# Patient Record
Sex: Female | Born: 1970 | Race: Black or African American | Hispanic: No | Marital: Married | Smoking: Never smoker
Health system: Southern US, Community
[De-identification: ages and names within clinical notes are randomized; demographics above are authoritative.]

## PROBLEM LIST (undated history)

## (undated) DIAGNOSIS — M51369 Other intervertebral disc degeneration, lumbar region without mention of lumbar back pain or lower extremity pain: Secondary | ICD-10-CM

## (undated) DIAGNOSIS — IMO0002 Reserved for concepts with insufficient information to code with codable children: Secondary | ICD-10-CM

## (undated) DIAGNOSIS — Z8619 Personal history of other infectious and parasitic diseases: Secondary | ICD-10-CM

## (undated) DIAGNOSIS — E041 Nontoxic single thyroid nodule: Secondary | ICD-10-CM

## (undated) DIAGNOSIS — Z8742 Personal history of other diseases of the female genital tract: Secondary | ICD-10-CM

## (undated) DIAGNOSIS — R87619 Unspecified abnormal cytological findings in specimens from cervix uteri: Secondary | ICD-10-CM

## (undated) DIAGNOSIS — D259 Leiomyoma of uterus, unspecified: Secondary | ICD-10-CM

## (undated) DIAGNOSIS — M1990 Unspecified osteoarthritis, unspecified site: Secondary | ICD-10-CM

## (undated) DIAGNOSIS — N946 Dysmenorrhea, unspecified: Secondary | ICD-10-CM

## (undated) DIAGNOSIS — K219 Gastro-esophageal reflux disease without esophagitis: Secondary | ICD-10-CM

## (undated) DIAGNOSIS — A64 Unspecified sexually transmitted disease: Secondary | ICD-10-CM

## (undated) DIAGNOSIS — N920 Excessive and frequent menstruation with regular cycle: Secondary | ICD-10-CM

## (undated) DIAGNOSIS — Z973 Presence of spectacles and contact lenses: Secondary | ICD-10-CM

## (undated) DIAGNOSIS — D649 Anemia, unspecified: Secondary | ICD-10-CM

## (undated) DIAGNOSIS — I10 Essential (primary) hypertension: Secondary | ICD-10-CM

## (undated) HISTORY — DX: Reserved for concepts with insufficient information to code with codable children: IMO0002

## (undated) HISTORY — PX: BIOPSY THYROID: PRO38

## (undated) HISTORY — PX: TOOTH EXTRACTION: SUR596

## (undated) HISTORY — DX: Gastro-esophageal reflux disease without esophagitis: K21.9

## (undated) HISTORY — DX: Nontoxic single thyroid nodule: E04.1

## (undated) HISTORY — DX: Unspecified abnormal cytological findings in specimens from cervix uteri: R87.619

## (undated) HISTORY — DX: Unspecified osteoarthritis, unspecified site: M19.90

## (undated) HISTORY — DX: Unspecified sexually transmitted disease: A64

## (undated) HISTORY — DX: Essential (primary) hypertension: I10

---

## 1999-06-16 ENCOUNTER — Other Ambulatory Visit: Admission: RE | Admit: 1999-06-16 | Discharge: 1999-06-16 | Payer: Self-pay | Admitting: Obstetrics and Gynecology

## 2000-07-26 ENCOUNTER — Other Ambulatory Visit: Admission: RE | Admit: 2000-07-26 | Discharge: 2000-07-26 | Payer: Self-pay | Admitting: Obstetrics and Gynecology

## 2001-11-14 ENCOUNTER — Other Ambulatory Visit: Admission: RE | Admit: 2001-11-14 | Discharge: 2001-11-14 | Payer: Self-pay | Admitting: Obstetrics and Gynecology

## 2002-09-28 ENCOUNTER — Inpatient Hospital Stay (HOSPITAL_COMMUNITY): Admission: AD | Admit: 2002-09-28 | Discharge: 2002-09-28 | Payer: Self-pay | Admitting: Obstetrics and Gynecology

## 2003-03-16 ENCOUNTER — Inpatient Hospital Stay (HOSPITAL_COMMUNITY): Admission: AD | Admit: 2003-03-16 | Discharge: 2003-03-19 | Payer: Self-pay | Admitting: Obstetrics and Gynecology

## 2003-05-04 ENCOUNTER — Other Ambulatory Visit: Admission: RE | Admit: 2003-05-04 | Discharge: 2003-05-04 | Payer: Self-pay | Admitting: Obstetrics and Gynecology

## 2004-08-09 ENCOUNTER — Other Ambulatory Visit: Admission: RE | Admit: 2004-08-09 | Discharge: 2004-08-09 | Payer: Self-pay | Admitting: Obstetrics and Gynecology

## 2005-12-06 ENCOUNTER — Other Ambulatory Visit: Admission: RE | Admit: 2005-12-06 | Discharge: 2005-12-06 | Payer: Self-pay | Admitting: Obstetrics & Gynecology

## 2006-01-16 DIAGNOSIS — E042 Nontoxic multinodular goiter: Secondary | ICD-10-CM

## 2006-01-16 HISTORY — DX: Nontoxic multinodular goiter: E04.2

## 2006-03-07 ENCOUNTER — Encounter: Admission: RE | Admit: 2006-03-07 | Discharge: 2006-03-07 | Payer: Self-pay | Admitting: *Deleted

## 2006-04-26 ENCOUNTER — Encounter: Admission: RE | Admit: 2006-04-26 | Discharge: 2006-04-26 | Payer: Self-pay | Admitting: Family Medicine

## 2007-01-02 ENCOUNTER — Encounter: Admission: RE | Admit: 2007-01-02 | Discharge: 2007-01-02 | Payer: Self-pay | Admitting: Family Medicine

## 2007-01-15 ENCOUNTER — Encounter: Admission: RE | Admit: 2007-01-15 | Discharge: 2007-01-15 | Payer: Self-pay | Admitting: Family Medicine

## 2007-01-15 ENCOUNTER — Encounter (INDEPENDENT_AMBULATORY_CARE_PROVIDER_SITE_OTHER): Payer: Self-pay | Admitting: Interventional Radiology

## 2007-01-15 ENCOUNTER — Other Ambulatory Visit: Admission: RE | Admit: 2007-01-15 | Discharge: 2007-01-15 | Payer: Self-pay | Admitting: Interventional Radiology

## 2007-01-25 ENCOUNTER — Other Ambulatory Visit: Admission: RE | Admit: 2007-01-25 | Discharge: 2007-01-25 | Payer: Self-pay | Admitting: Obstetrics and Gynecology

## 2007-05-07 ENCOUNTER — Encounter: Admission: RE | Admit: 2007-05-07 | Discharge: 2007-05-07 | Payer: Self-pay | Admitting: Family Medicine

## 2007-08-26 ENCOUNTER — Encounter: Admission: RE | Admit: 2007-08-26 | Discharge: 2007-08-26 | Payer: Self-pay

## 2008-04-06 ENCOUNTER — Other Ambulatory Visit: Admission: RE | Admit: 2008-04-06 | Discharge: 2008-04-06 | Payer: Self-pay | Admitting: Obstetrics and Gynecology

## 2009-04-16 IMAGING — US US SOFT TISSUE HEAD/NECK
1 series · 14 of 18 positions shown · non-contrast
Comparison: none

CLINICAL DATA: Enlarged thyroid. 
 THYROID ULTRASOUND:
TECHNIQUE: Ultrasound examination of the thyroid gland and adjacent soft tissue structures was performed.

[Series 1: unknown · 0.09mm/px · 14 of 18 slices shown]
[im 1/18]
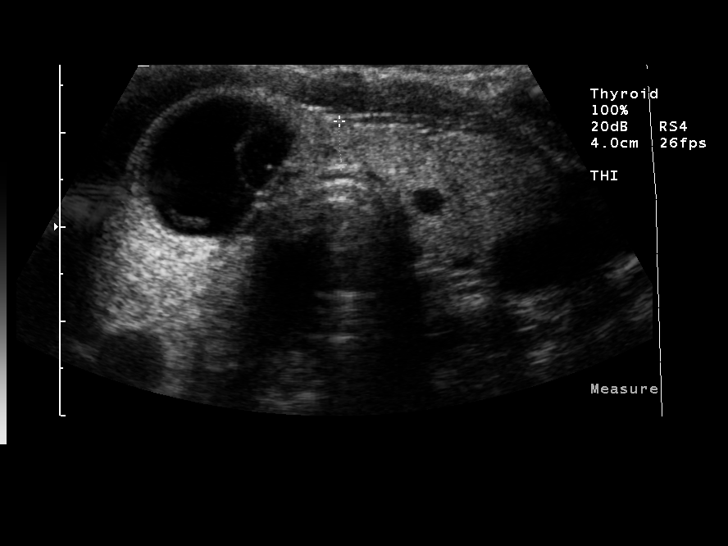
[im 2/18]
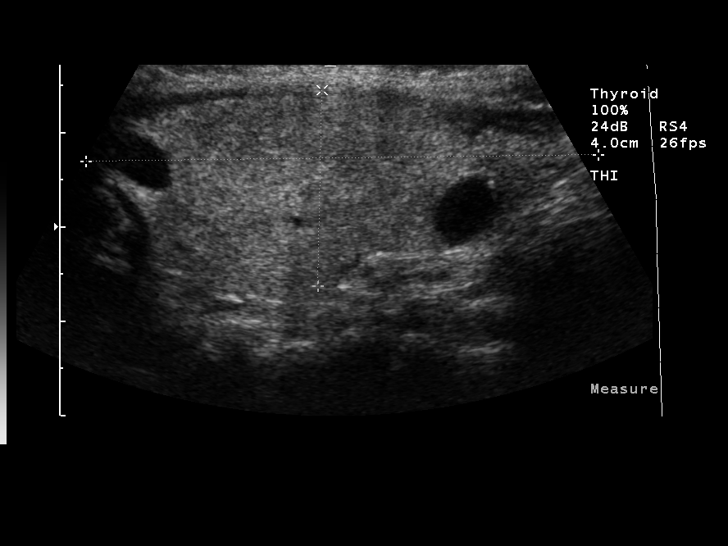
[im 4/18]
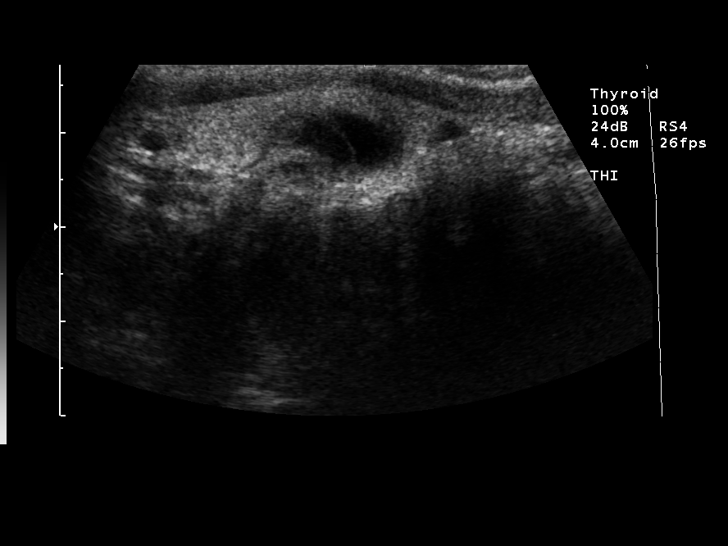
[im 5/18]
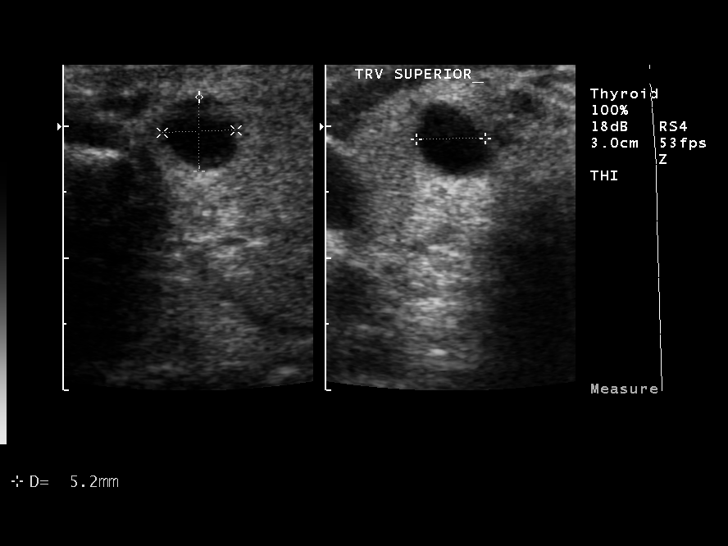
[im 6/18]
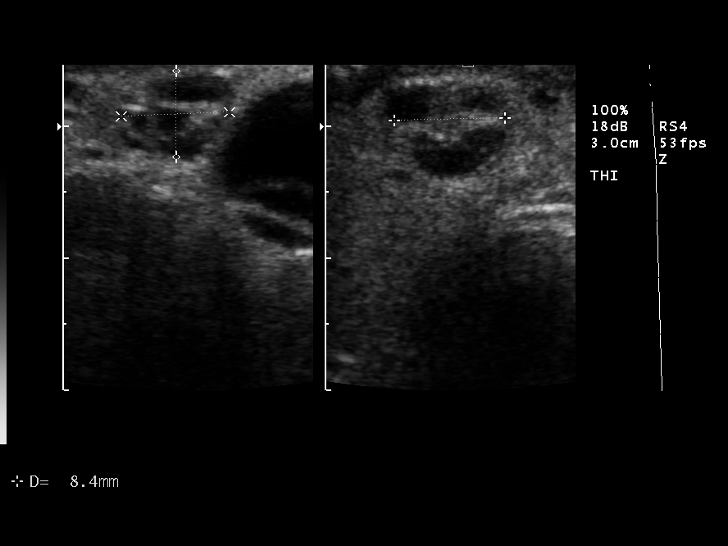
[im 8/18]
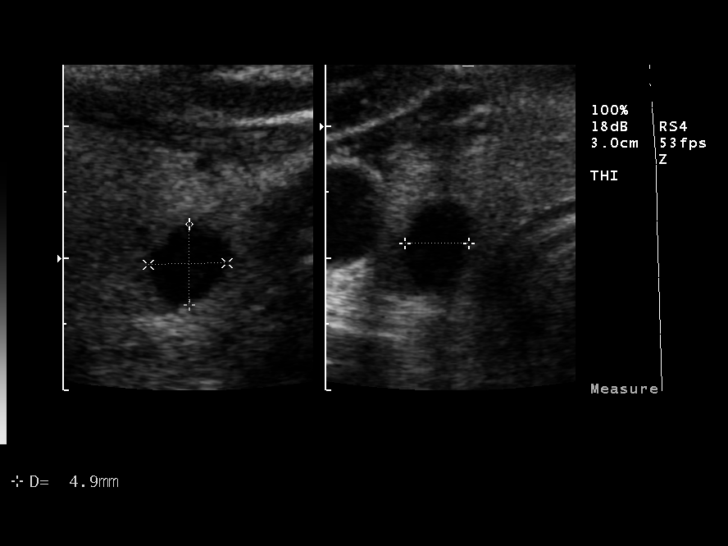
[im 9/18]
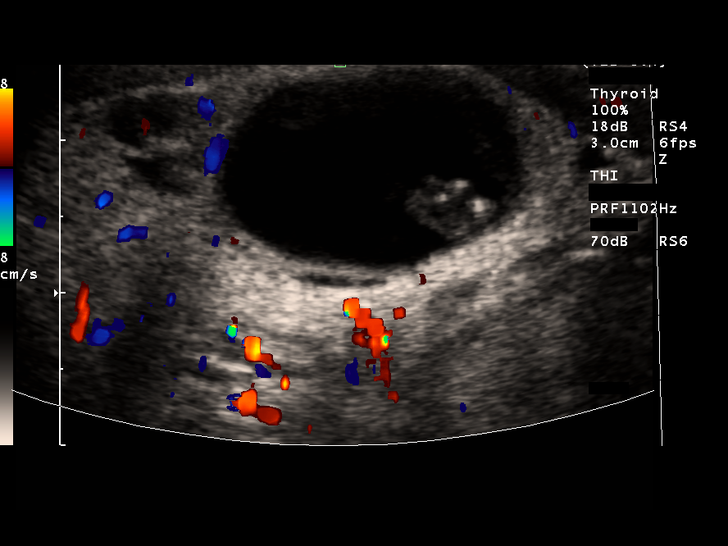
[im 10/18]
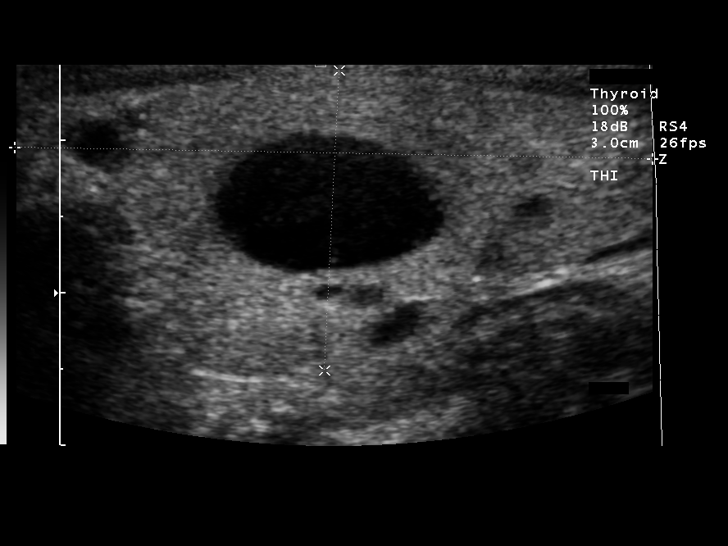
[im 11/18]
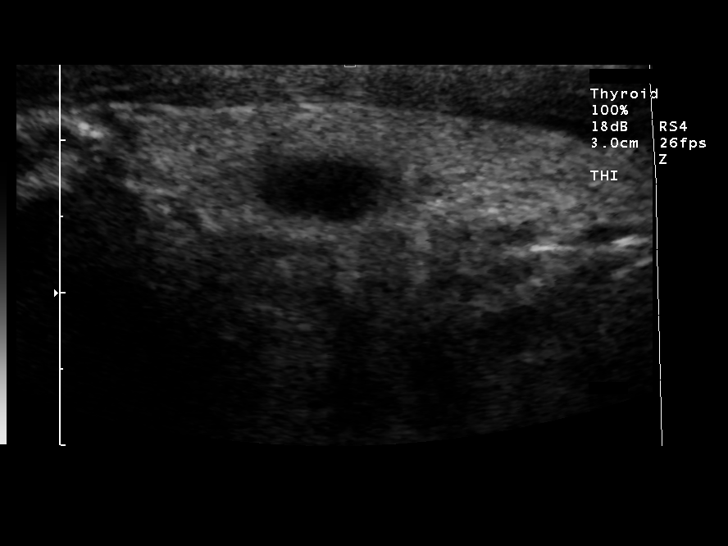
[im 13/18]
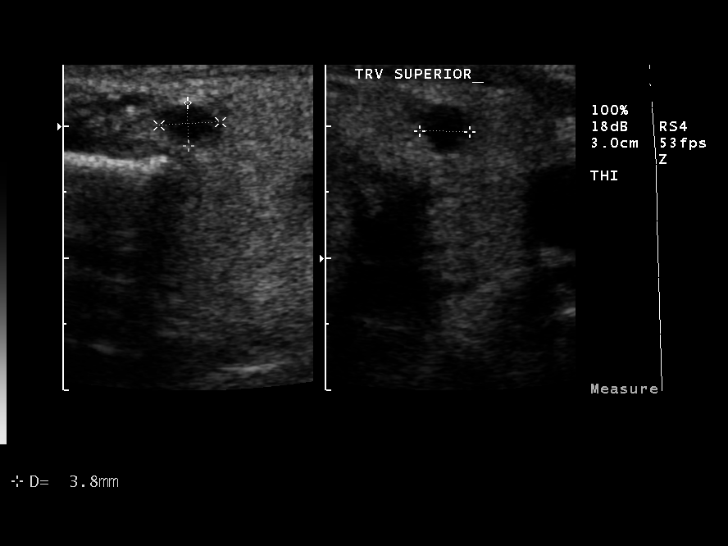
[im 14/18]
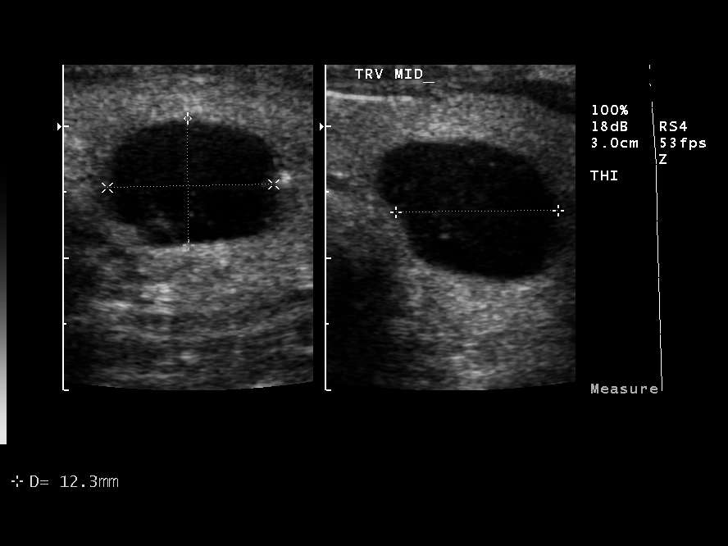
[im 15/18]
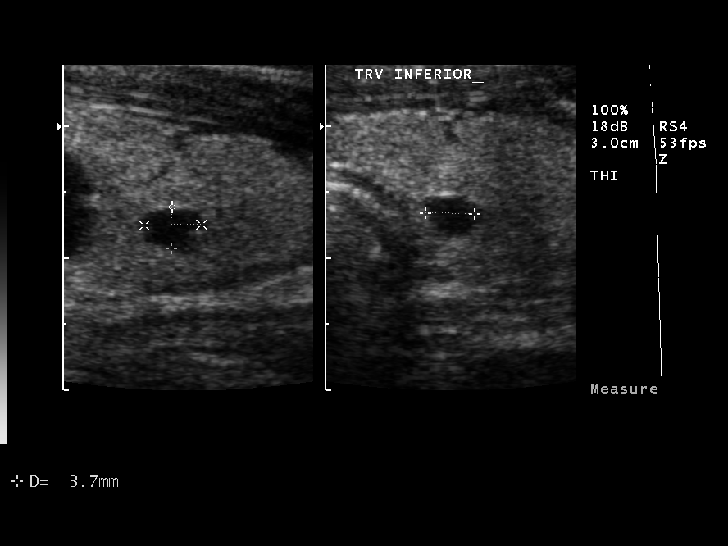
[im 17/18]
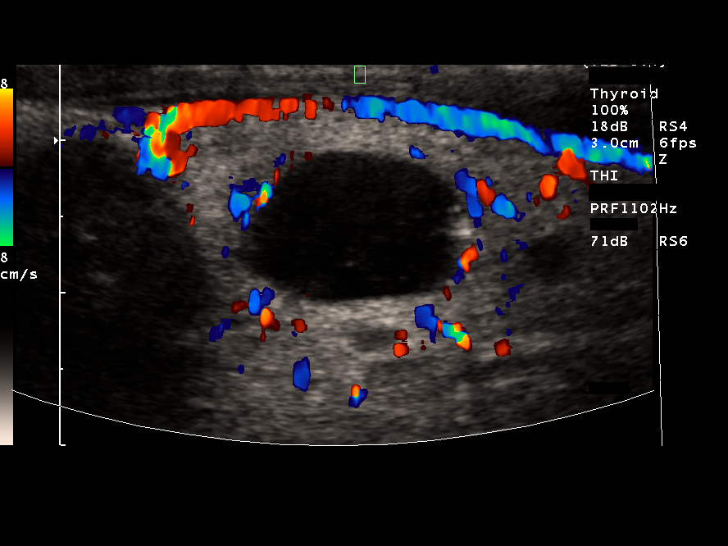
[im 18/18]
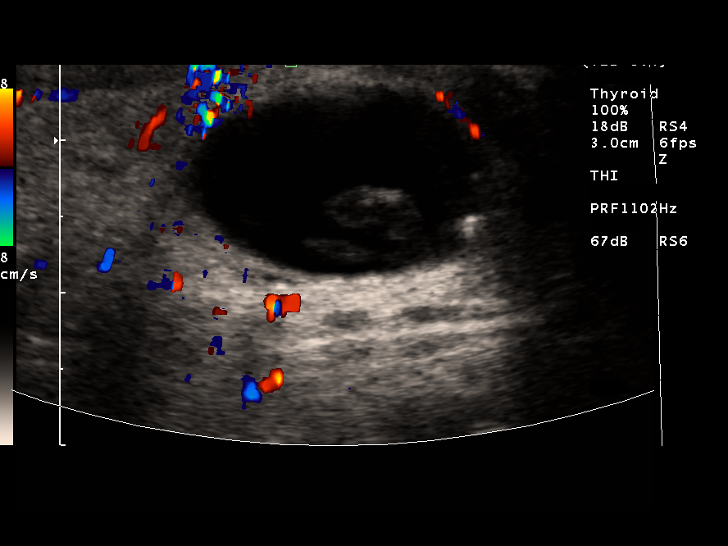

[14 of 18 positions shown; findings below may reference images not displayed]

FINDINGS: The thyroid gland is within upper limits of normal.  The right lobe measures 5.4 cm sagittally with a depth of 2.0 cm and width of 1.7 cm.  The left lobe measures 4.2 x 2.0 x 1.7 cm with the isthmus measuring 4.6 mm.  Multiple nodules are present bilaterally primarily cystic.  The largest nodule is in the lower pole of the right lobe measuring 1.8 x 1.3 x 1.7 cm.  Another cystic lesion is noted in the left midlobe of 1.2 x 1.2 x 1.0 cm.  Several smaller cysts are present of 8 mm or less in diameter, with the 8 mm nodule being complex in the upper medial aspect of the right lobe measuring 8 mm.
IMPRESSION: Multiple cystic nodules bilaterally as described above.  Only a single complex nodule is noted in the upper right lobe medially.  Thyroid gland is within upper limits of normal.

## 2009-04-16 IMAGING — US US SOFT TISSUE HEAD/NECK
1 series · 2 of 2 positions shown · non-contrast
Comparison: none

CLINICAL DATA: Enlarged thyroid. 
 THYROID ULTRASOUND:
TECHNIQUE: Ultrasound examination of the thyroid gland and adjacent soft tissue structures was performed.

[Series 1: unknown · 0.05mm/px · 2 of 2 slices shown]
[im 1/2]
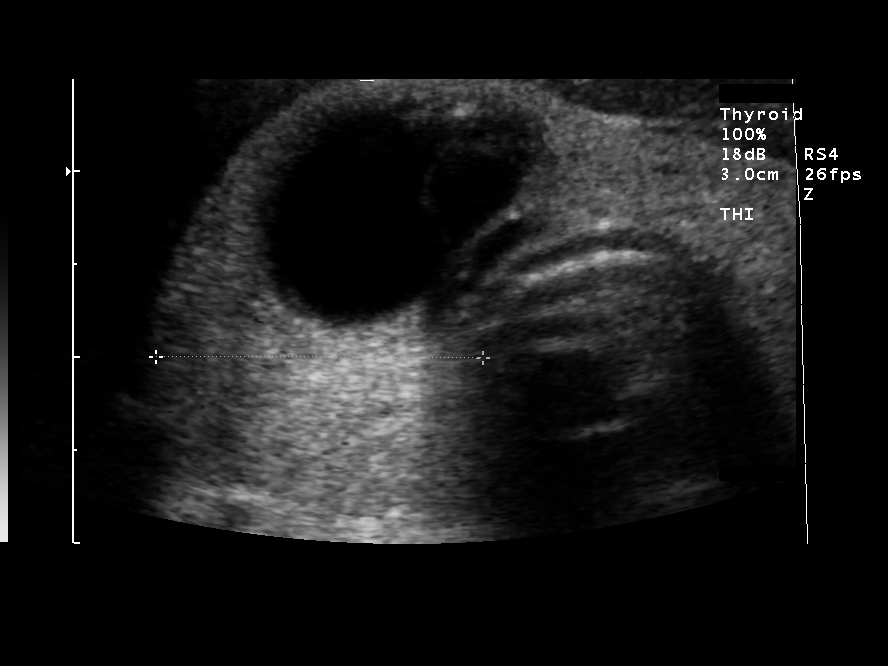
[im 2/2]
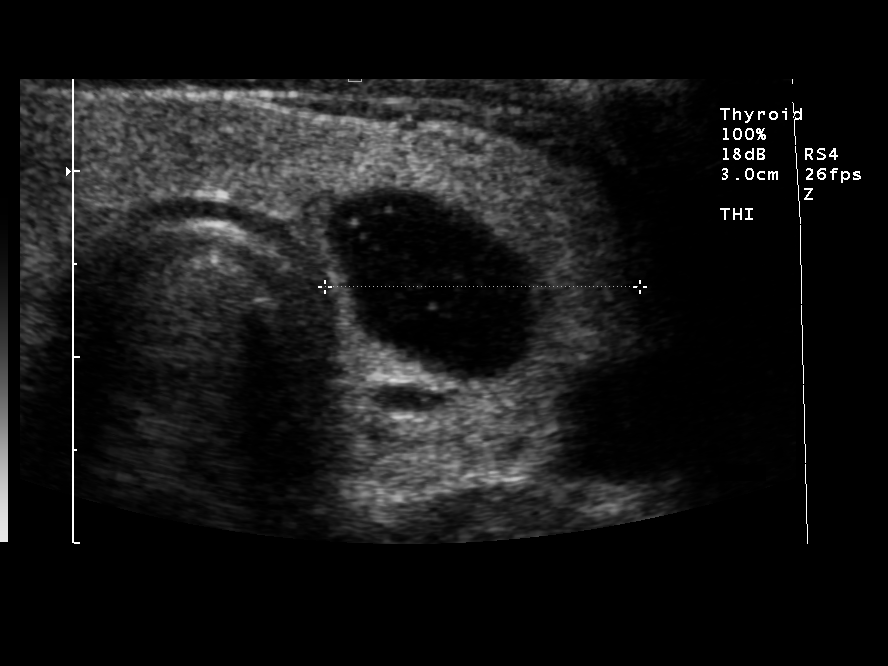

[2 of 2 positions shown; findings below may reference images not displayed]

FINDINGS: The thyroid gland is within upper limits of normal.  The right lobe measures 5.4 cm sagittally with a depth of 2.0 cm and width of 1.7 cm.  The left lobe measures 4.2 x 2.0 x 1.7 cm with the isthmus measuring 4.6 mm.  Multiple nodules are present bilaterally primarily cystic.  The largest nodule is in the lower pole of the right lobe measuring 1.8 x 1.3 x 1.7 cm.  Another cystic lesion is noted in the left midlobe of 1.2 x 1.2 x 1.0 cm.  Several smaller cysts are present of 8 mm or less in diameter, with the 8 mm nodule being complex in the upper medial aspect of the right lobe measuring 8 mm.
IMPRESSION: Multiple cystic nodules bilaterally as described above.  Only a single complex nodule is noted in the upper right lobe medially.  Thyroid gland is within upper limits of normal.

## 2010-06-03 NOTE — H&P (Signed)
NAME:  Jennifer Nolan, Jennifer Nolan                       ACCOUNT NO.:  [id number]   MEDICAL RECORD NO.:  [id number]                   PATIENT TYPE:  INP   LOCATION:  9168                                 FACILITY:  [facility]   PHYSICIAN:  Osborn Coho, M.D.                DATE OF BIRTH:  28-Nov-1970   DATE OF ADMISSION:  03/16/2003  DATE OF DISCHARGE:                                HISTORY & PHYSICAL   [name] is a 40 year old gravida 1, para 0, at 40 weeks, who presents  with uterine contractions every one to two minutes since 9 p.m.  She reports  positive bloody show, reports positive fetal movement.  She denies any HSV  lesions or prodrome.  Pregnancy has been remarkable for:   1. 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  2. Questionable LMP.    PRENATAL LABORATORY DATA:  Blood type is A positive, Rh antibody negative.  VDRL nonreactive.  Sickle cell test negative.  GC and Chlamydia cultures  were negative.  Pap was normal in October.  Hepatitis B surface antigen was  negative.  Rubella was positive.  Syphilis was nonreactive.  HIV was  nonreactive.  Cystic fibrosis testing was negative.  The patient has a  positive urine culture at initial visit for Citrobacter, which was treated  with Macrobid.  EDC of March 16, 2003, was established by ultrasound at  12 weeks.  Quadruple screen was negative.  She had a normal Glucola.  Her  hemoglobin upon entering the practice was 11.  It was 10 at 28 weeks.  She  started iron supplementation at 28 weeks.  She had some cervical pain at 33  weeks with no abnormal findings.  She had a urine culture at that time that  was negative.  She began Valtrex prophylaxis suppressive therapy at 34  weeks.  Her group B strep, GC and Chlamydia cultures were all negative at 36  weeks.   PAST OBSTETRICAL HISTORY:  The patient is a primigravida.   PAST MEDICAL HISTORY:  The patient was on oral contraceptives and stopped  approximately three years ago.  Then she was a condom user.  She was treated  for Chlamydia eight years ago.  She was diagnosed with herpes in 1992 and  has had two to three outbreaks per year but none during pregnancy.  She has  frequent yeast infections.  She reports the usual childhood illnesses.  She  has one to two urinary tract infections a year.   She has no known medication allergies.   FAMILY HISTORY:  Her maternal grandfather has heart disease.  Her maternal  grandmother had hypertension and is on medication.  Her father has thyroid  problems.  Her maternal uncle has chronic renal disease.   GENETIC HISTORY:  Unremarkable.   SOCIAL HISTORY:  The patient is single.  The father of the baby is  involved  and supportive.  His name is Jennifer Nolan.  The patient is [place], of the [employer].  She has been followed by the certified  nurse midwife service of [place].  She denies any alcohol, drug,  or tobacco use during this pregnancy.  She is graduate-educated and is  employed as a Scientist, physiological.  Her partner is in a Engineer, building services and  is a Runner, broadcasting/film/video in a [employer] high school.   PHYSICAL EXAMINATION:  VITAL SIGNS:  Stable.  The patient is afebrile.  HEENT:  Within normal limits.  CHEST:  Bilateral breath sounds are clear.  CARDIAC:  Regular rate and rhythm without murmur.  BREASTS:  Soft and nontender.  ABDOMEN:  Fundal height is approximately 38 cm.  The estimated fetal weight  is 7-8 pounds.  Uterine contractions are every two minutes, 60 seconds in  duration, strong quality.  Fetal monitor shows a nonreactive tracing but a  negative spontaneous CST.  There are no decelerations noted.  PELVIC:  Cervix is slightly posterior, 5 cm, 80%, vertex at a -1 to a -2.  There is bloody show noted.  No HSV lesions are noted.  EXTREMITIES:  Deep tendon reflexes are 2+ without clonus.  There is a trace  edema noted.   IMPRESSION:  1. Intrauterine pregnancy  at 40 weeks.  2. Active labor.   PLAN:  1. Admit to birthing suite per consult with Dr. Su Hilt as attending     physician.  2. Routine certified nurse midwife orders.  3. The patient wants IV pain medication initially and then may desire an     epidural as labor progresses.     Jennifer Nolan, C.N.M.                   Osborn Coho, M.D.    VLL/MEDQ  D:  03/17/2003  T:  03/17/2003  Job:  [id number]

## 2010-12-26 ENCOUNTER — Other Ambulatory Visit: Payer: Self-pay | Admitting: Certified Nurse Midwife

## 2010-12-26 DIAGNOSIS — Z1231 Encounter for screening mammogram for malignant neoplasm of breast: Secondary | ICD-10-CM

## 2011-01-27 ENCOUNTER — Ambulatory Visit
Admission: RE | Admit: 2011-01-27 | Discharge: 2011-01-27 | Disposition: A | Discharge status: Home / Self Care | Payer: 59 | Source: Ambulatory Visit | Attending: Certified Nurse Midwife | Admitting: Certified Nurse Midwife

## 2011-01-27 DIAGNOSIS — Z1231 Encounter for screening mammogram for malignant neoplasm of breast: Secondary | ICD-10-CM

## 2012-03-12 ENCOUNTER — Other Ambulatory Visit: Payer: Self-pay

## 2012-03-12 DIAGNOSIS — Z1231 Encounter for screening mammogram for malignant neoplasm of breast: Secondary | ICD-10-CM

## 2012-04-09 ENCOUNTER — Ambulatory Visit
Admission: RE | Admit: 2012-04-09 | Discharge: 2012-04-09 | Disposition: A | Discharge status: Home / Self Care | Payer: 59 | Source: Ambulatory Visit

## 2012-04-09 DIAGNOSIS — Z1231 Encounter for screening mammogram for malignant neoplasm of breast: Secondary | ICD-10-CM

## 2012-09-04 ENCOUNTER — Encounter: Payer: Self-pay | Admitting: Certified Nurse Midwife

## 2012-09-20 ENCOUNTER — Ambulatory Visit: Payer: Self-pay | Admitting: Certified Nurse Midwife

## 2012-09-26 ENCOUNTER — Encounter: Payer: Self-pay | Admitting: Certified Nurse Midwife

## 2012-09-27 ENCOUNTER — Encounter: Payer: Self-pay | Admitting: Certified Nurse Midwife

## 2012-09-27 ENCOUNTER — Ambulatory Visit (INDEPENDENT_AMBULATORY_CARE_PROVIDER_SITE_OTHER): Payer: 59 | Admitting: Certified Nurse Midwife

## 2012-09-27 VITALS — BP 104/70 | HR 64 | Resp 16 | Ht 65.25 in | Wt 163.0 lb

## 2012-09-27 DIAGNOSIS — Z0000 Encounter for general adult medical examination without abnormal findings: Secondary | ICD-10-CM

## 2012-09-27 DIAGNOSIS — Z01419 Encounter for gynecological examination (general) (routine) without abnormal findings: Secondary | ICD-10-CM

## 2012-09-27 DIAGNOSIS — B009 Herpesviral infection, unspecified: Secondary | ICD-10-CM

## 2012-09-27 LAB — HEMOGLOBIN, FINGERSTICK: Hemoglobin, fingerstick: 12.2 g/dL (ref 12.0–16.0)

## 2012-09-27 MED ORDER — CONCEPT OB 130-92.4-1 MG PO CAPS: 1.0000 | ORAL_CAPSULE | Freq: Every day | ORAL | Status: DC

## 2012-09-27 MED ORDER — VALACYCLOVIR HCL 1 G PO TABS: 1000.0000 mg | ORAL_TABLET | ORAL | Status: DC | PRN

## 2012-09-27 NOTE — Progress Notes (Signed)
42 y.o. G1P1001 Married African American Fe here for annual exam. Periods changing slightly in amount, now has 2 heavy days then moderate to scant. No cramping. Heavy bleeding does not require clothing changes. No other health changes, or health issues today.  Patient's last menstrual period was 09/10/2012.          Sexually active: yes  The current method of family planning is none.    Exercising: no  exercise Smoker:  no  Health Maintenance: Pap:  09-20-11 neg  HPV HR neg MMG: 04-09-12 normal Colonoscopy:  none BMD:   none TDaP: 2012 Labs: Hgb-12.2 Self breast exam: done monthly   reports that she has never smoked. She does not have any smokeless tobacco history on file. She reports that she drinks about 0.5 ounces of alcohol per week. She reports that she does not use illicit drugs.  Past Medical History  Diagnosis Date  . STD (sexually transmitted disease)     HSV2  . Abnormal Pap smear   . GERD (gastroesophageal reflux disease)   . Thyroid cyst     drained it    Past Surgical History  Procedure Laterality Date  . Biopsy thyroid      Current Outpatient Prescriptions  Medication Sig Dispense Refill  . cycloSPORINE (RESTASIS) 0.05 % ophthalmic emulsion 1 drop daily.       . IRON PO Take by mouth daily.      Marland Kitchen levocetirizine (XYZAL) 5 MG tablet daily.      Marland Kitchen NASONEX 50 MCG/ACT nasal spray daily.      Burnis Medin w/o A Vit-FeFum-FePo-FA (CONCEPT OB) 130-92.4-1 MG CAPS Take by mouth daily.      . valACYclovir (VALTREX) 1000 MG tablet Take 1,000 mg by mouth as needed.        No current facility-administered medications for this visit.    Family History  Problem Relation Age of Onset  . Hypertension Mother   . Diabetes Mother   . Hypertension Maternal Grandmother     ROS:  Pertinent items are noted in HPI.  Otherwise, a comprehensive ROS was negative.  Exam:   BP 104/70  Pulse 64  Resp 16  Ht 5' 5.25" (1.657 m)  Wt 163 lb (73.936 kg)  BMI 26.93 kg/m2  LMP  09/10/2012 Height: 5' 5.25" (165.7 cm)  Ht Readings from Last 3 Encounters:  09/27/12 5' 5.25" (1.657 m)    General appearance: alert, cooperative and appears stated age Head: Normocephalic, without obvious abnormality, atraumatic Neck: no adenopathy, supple, symmetrical, trachea midline and thyroid enlarged bilateral(known enlargement) no nodules palpated. No size change Lungs: clear to auscultation bilaterally Breasts: normal appearance, no masses or tenderness, No nipple retraction or dimpling, No nipple discharge or bleeding, No axillary or supraclavicular adenopathy Heart: regular rate and rhythm Abdomen: soft, non-tender; no masses,  no organomegaly Extremities: extremities normal, atraumatic, no cyanosis or edema Skin: Skin color, texture, turgor normal. No rashes or lesions Lymph nodes: Cervical, supraclavicular, and axillary nodes normal. No abnormal inguinal nodes palpated Neurologic: Grossly normal   Pelvic: External genitalia:  no lesions              Urethra:  normal appearing urethra with no masses, tenderness or lesions              Bartholin's and Skene's: normal                 Vagina: normal appearing vagina with normal color and discharge, no lesions  Cervix: normal, non tender              Pap taken: no Bimanual Exam:  Uterus:  normal size, contour, position, consistency, mobility, non-tender and anteflexed              Adnexa: normal adnexa and no mass, fullness, tenderness               Rectovaginal: Confirms               Anus:  normal sphincter tone, no lesions  A:  Well Woman with normal exam  Contraception none desired, continues on prenatal vitamins in case of pregnancy( history of infertility)  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

## 2012-09-27 NOTE — Patient Instructions (Signed)

## 2012-09-30 NOTE — Progress Notes (Signed)
Note reviewed, agree with plan.  Mertie Haslem, MD  

## 2012-11-21 ENCOUNTER — Other Ambulatory Visit: Payer: Self-pay

## 2012-11-26 ENCOUNTER — Encounter: Payer: Self-pay | Admitting: Certified Nurse Midwife

## 2013-05-20 ENCOUNTER — Other Ambulatory Visit: Payer: Self-pay

## 2013-05-20 DIAGNOSIS — Z1231 Encounter for screening mammogram for malignant neoplasm of breast: Secondary | ICD-10-CM

## 2013-05-23 ENCOUNTER — Ambulatory Visit
Admission: RE | Admit: 2013-05-23 | Discharge: 2013-05-23 | Disposition: A | Discharge status: Home / Self Care | Payer: 59 | Source: Ambulatory Visit

## 2013-05-23 DIAGNOSIS — Z1231 Encounter for screening mammogram for malignant neoplasm of breast: Secondary | ICD-10-CM

## 2013-10-03 ENCOUNTER — Ambulatory Visit: Payer: 59 | Admitting: Certified Nurse Midwife

## 2013-10-09 ENCOUNTER — Ambulatory Visit: Payer: 59 | Admitting: Certified Nurse Midwife

## 2013-10-17 ENCOUNTER — Ambulatory Visit (INDEPENDENT_AMBULATORY_CARE_PROVIDER_SITE_OTHER): Payer: 59 | Admitting: Certified Nurse Midwife

## 2013-10-17 ENCOUNTER — Encounter: Payer: Self-pay | Admitting: Certified Nurse Midwife

## 2013-10-17 VITALS — BP 104/64 | HR 68 | Resp 16 | Ht 65.25 in | Wt 164.0 lb

## 2013-10-17 DIAGNOSIS — R6889 Other general symptoms and signs: Secondary | ICD-10-CM

## 2013-10-17 DIAGNOSIS — Z01411 Encounter for gynecological examination (general) (routine) with abnormal findings: Secondary | ICD-10-CM

## 2013-10-17 DIAGNOSIS — Z0000 Encounter for general adult medical examination without abnormal findings: Secondary | ICD-10-CM

## 2013-10-17 DIAGNOSIS — Z124 Encounter for screening for malignant neoplasm of cervix: Secondary | ICD-10-CM

## 2013-10-17 DIAGNOSIS — E049 Nontoxic goiter, unspecified: Secondary | ICD-10-CM

## 2013-10-17 DIAGNOSIS — N920 Excessive and frequent menstruation with regular cycle: Secondary | ICD-10-CM

## 2013-10-17 LAB — LIPID PANEL
Cholesterol: 138 mg/dL (ref 0–200)
HDL: 45 mg/dL (ref >39)
LDL Cholesterol: 71 mg/dL (ref 0–99)
Total CHOL/HDL Ratio: 3.1 Ratio
Triglycerides: 112 mg/dL (ref <150)
VLDL: 22 mg/dL (ref 0–40)

## 2013-10-17 LAB — HEMOGLOBIN, FINGERSTICK: Hemoglobin, fingerstick: 11.9 g/dL — ABNORMAL LOW (ref 12.0–16.0)

## 2013-10-17 LAB — TSH: TSH: 0.577 u[IU]/mL (ref 0.350–4.500)

## 2013-10-17 MED ORDER — NORETHIN ACE-ETH ESTRAD-FE 1-20 MG-MCG(24) PO TABS: 1.0000 | ORAL_TABLET | Freq: Every day | ORAL | Status: DC

## 2013-10-17 NOTE — Patient Instructions (Signed)

## 2013-10-17 NOTE — Progress Notes (Signed)
43 y.o. G1P1001 Married African American Fe here for annual exam. Periods have become heavier, with more cramping and need for medication during period. Patient interested in cycle control again. Has taken OCP before for cycle control with good response. Sees PCP prn.  No other health issues today. [place]  Patient's last menstrual period was 10/10/2013.          Sexually active: Yes.    The current method of family planning is diaphragm.    Exercising: No.  exercise Smoker:  no  Health Maintenance: Pap: 09-20-11 neg HPV HR neg MMG:  05-23-13 density category d, birads category 1: neg stressed 3D mammogram needed yearly Colonoscopy:  none BMD:   none TDaP:  2014 Labs: Hgb-11.9 Self breast exam: done occ   reports that she has never smoked. She does not have any smokeless tobacco history on file. She reports that she drinks about .5 ounces of alcohol per week. She reports that she does not use illicit drugs.  Past Medical History  Diagnosis Date  . STD (sexually transmitted disease)     HSV2  . Abnormal Pap smear   . GERD (gastroesophageal reflux disease)   . Thyroid cyst     drained it    Past Surgical History  Procedure Laterality Date  . Biopsy thyroid      Current Outpatient Prescriptions  Medication Sig Dispense Refill  . cycloSPORINE (RESTASIS) 0.05 % ophthalmic emulsion 1 drop daily.       . IRON PO Take by mouth daily.      Marland Kitchen. levocetirizine (XYZAL) 5 MG tablet daily.      Marland Kitchen. NASONEX 50 MCG/ACT nasal spray daily.      Burnis Medin. Prenat w/o A Vit-FeFum-FePo-FA (CONCEPT OB) 130-92.4-1 MG CAPS Take 1 capsule by mouth daily.  90 capsule  4  . valACYclovir (VALTREX) 1000 MG tablet Take 1 tablet (1,000 mg total) by mouth as needed.  90 tablet  4   No current facility-administered medications for this visit.    Family History  Problem Relation Age of Onset  . Hypertension Mother   . Diabetes Mother   . Hypertension Maternal Grandmother     ROS:  Pertinent items  are noted in HPI.  Otherwise, a comprehensive ROS was negative.  Exam:   BP 104/64  Pulse 68  Resp 16  Ht 5' 5.25" (1.657 m)  Wt 164 lb (74.39 kg)  BMI 27.09 kg/m2  LMP 10/10/2013 Height: 5' 5.25" (165.7 cm)  Ht Readings from Last 3 Encounters:  10/17/13 5' 5.25" (1.657 m)  09/27/12 5' 5.25" (1.657 m)    General appearance: alert, cooperative and appears stated age Head: Normocephalic, without obvious abnormality, atraumatic Neck: no adenopathy, supple, symmetrical, trachea midline and thyroid enlarged with cyst palpated(known)   Lungs: clear to auscultation bilaterally Breasts: normal appearance, no masses or tenderness, No nipple retraction or dimpling, No nipple discharge or bleeding, No axillary or supraclavicular adenopathy Heart: regular rate and rhythm Abdomen: soft, non-tender; no masses,  no organomegaly Extremities: extremities normal, atraumatic, no cyanosis or edema Skin: Skin color, texture, turgor normal. No rashes or lesions Lymph nodes: Cervical, supraclavicular, and axillary nodes normal. No abnormal inguinal nodes palpated Neurologic: Grossly normal   Pelvic: External genitalia:  no lesions              Urethra:  normal appearing urethra with no masses, tenderness or lesions              Bartholin's  and Skene's: normal                 Vagina: normal appearing vagina with normal color and discharge, no lesions              Cervix: normal,non tender, no lesions              Pap taken: Yes.   Bimanual Exam:  Uterus:  normal size, contour, position, consistency, mobility, non-tender and anteverted              Adnexa: normal adnexa and no mass, fullness, tenderness               Rectovaginal: Confirms               Anus:  normal sphincter tone, no lesions  A:  Well Woman with normal exam  Menorrhagia desires cycle control again with OCP  Enlarged thyroid with know cyst, sees endocrine prn  Screening labs  P:   Reviewed health and wellness pertinent to  exam  Rx Loestrin 24 Fe see order   Patient will come in after 3 month use for evaluation of menorrhagia. Warning signs of excessive bleeding reviewed.  Encouraged patient to follow up with thyroid size this year.  Lab: TSH Lipid panel Hgb A1-c  Pap smear taken today with HPV reflex   counseled on breast self exam, mammography screening, adequate intake of calcium and vitamin D, diet and exercise  return annually or prn  An After Visit Summary was printed and given to the patient.

## 2013-10-18 LAB — HEMOGLOBIN A1C
Hgb A1c MFr Bld: 5.9 % — ABNORMAL HIGH (ref <5.7)
Mean Plasma Glucose: 123 mg/dL — ABNORMAL HIGH (ref <117)

## 2013-10-19 NOTE — Progress Notes (Signed)
Encounter reviewed by Dr. Brook Silva.  

## 2013-10-20 ENCOUNTER — Telehealth: Payer: Self-pay

## 2013-10-20 LAB — IPS PAP TEST WITH REFLEX TO HPV

## 2013-10-20 NOTE — Telephone Encounter (Signed)
lmtcb

## 2013-10-20 NOTE — Telephone Encounter (Signed)
Pt is calling joy back °

## 2013-10-20 NOTE — Telephone Encounter (Signed)
Patient notified of results as written by provider 

## 2013-10-20 NOTE — Telephone Encounter (Signed)
Message copied by Eliezer BottomJOHNSON, DAVINA J on Mon Oct 20, 2013 10:20 AM ------      Message from: Verner CholLEONARD, DEBORAH S      Created: Mon Oct 20, 2013  7:53 AM       Notify patient that her Lipid panel looks great! TSH is normal      Her Hgb A1-c which is elevated indicates only increased risk of developing diabetes. She needs to work on regular exercise and decreasing concentrated carbohydrates. This does not mean she has diabetes only increase risk. Rescreen in 6 months order in ------

## 2013-10-20 NOTE — Addendum Note (Signed)
Addended by: Verner CholLEONARD, DEBORAH S on: 10/20/2013 07:54 AM   Modules accepted: Orders

## 2013-11-15 ENCOUNTER — Other Ambulatory Visit: Payer: Self-pay | Admitting: Certified Nurse Midwife

## 2013-11-17 ENCOUNTER — Encounter: Payer: Self-pay | Admitting: Certified Nurse Midwife

## 2013-12-16 ENCOUNTER — Other Ambulatory Visit: Payer: Self-pay | Admitting: Certified Nurse Midwife

## 2013-12-16 NOTE — Telephone Encounter (Signed)
Last refill 09/27/12 #90 / 4 refillls Last AEX 10/17/13  Please advise

## 2014-01-21 ENCOUNTER — Ambulatory Visit: Payer: 59 | Admitting: Certified Nurse Midwife

## 2014-01-22 ENCOUNTER — Telehealth: Payer: Self-pay | Admitting: Certified Nurse Midwife

## 2014-01-22 NOTE — Telephone Encounter (Signed)
Left message regarding upcoming appointment has been canceled and needs to be rescheduled. °

## 2014-01-26 ENCOUNTER — Ambulatory Visit: Payer: Self-pay | Admitting: Certified Nurse Midwife

## 2014-01-27 ENCOUNTER — Encounter: Payer: Self-pay | Admitting: Certified Nurse Midwife

## 2014-01-27 ENCOUNTER — Ambulatory Visit (INDEPENDENT_AMBULATORY_CARE_PROVIDER_SITE_OTHER): Payer: 59 | Admitting: Certified Nurse Midwife

## 2014-01-27 DIAGNOSIS — N946 Dysmenorrhea, unspecified: Secondary | ICD-10-CM

## 2014-01-27 DIAGNOSIS — N920 Excessive and frequent menstruation with regular cycle: Secondary | ICD-10-CM

## 2014-01-27 MED ORDER — PRENATAL VITAMINS 0.8 MG PO TABS: 1.0000 | ORAL_TABLET | Freq: Every day | ORAL | Status: DC

## 2014-01-27 NOTE — Progress Notes (Signed)
44 y.o. Married African American G1P1001here for evaluation of Loestrin [id number] initiated on October 2,2015 for menorrhagia Menses duration 1-4 days with light flow and minimal cramping!. Patient taking medication as prescribed. Denies missed pills, headaches, nausea, DVT warning signs or symptoms,  breakthrough bleeding, or other changes.   Keeping menses calendar. Needs refill on prenatal vitamins which work better for her. No other health issues today  O: Healthy female, WD WN Affect: normal orientation X 3    A: History of menorrhagia and dysmenorrhea with  Lo estrin 24 Fe working well Daily prenatal vitamins for fatigue  P: Continue Loestrin 24 Fe as prescribed has Rx until aex. Rx Prenatal Multivitamin see order.  18 minutes spent with patient  face to face counseling  Regarding OCP management..  RV aex

## 2014-01-27 NOTE — Patient Instructions (Signed)

## 2014-01-29 DIAGNOSIS — N946 Dysmenorrhea, unspecified: Secondary | ICD-10-CM | POA: Insufficient documentation

## 2014-01-29 DIAGNOSIS — N920 Excessive and frequent menstruation with regular cycle: Secondary | ICD-10-CM | POA: Insufficient documentation

## 2014-01-29 NOTE — Progress Notes (Signed)
Reviewed personally.  M. Suzanne Shundra Wirsing, MD.  

## 2014-02-05 ENCOUNTER — Telehealth: Payer: Self-pay | Admitting: Certified Nurse Midwife

## 2014-02-05 NOTE — Telephone Encounter (Signed)
Patient says her pharmacy (on file) has contacted our office concerning her prenatal vitamin prescription (questions) with no response from us. The pharmacy told her they will cancel this prescription if they do nit here from us soon..Marland Kitchen

## 2014-02-05 NOTE — Telephone Encounter (Signed)
Spoke with patient. Advised patient that we received fax from [facility]. [name] S. Leonard CNM has filled out form and I just faxed form back today with fax confirmation. Advised to return call if for any reason they have any further questions. Patient is agreeable. Form faxed with cover sheet for prenatal vitamin rx to [phone].  Routing to provider for final review. Patient agreeable to disposition. Will close encounter

## 2014-04-21 ENCOUNTER — Other Ambulatory Visit (INDEPENDENT_AMBULATORY_CARE_PROVIDER_SITE_OTHER): Payer: 59

## 2014-04-21 DIAGNOSIS — R6889 Other general symptoms and signs: Secondary | ICD-10-CM

## 2014-04-21 LAB — HEMOGLOBIN A1C
Hgb A1c MFr Bld: 6 % — ABNORMAL HIGH (ref <5.7)
Mean Plasma Glucose: 126 mg/dL — ABNORMAL HIGH (ref <117)

## 2014-04-23 ENCOUNTER — Telehealth: Payer: Self-pay | Admitting: Emergency Medicine

## 2014-04-23 DIAGNOSIS — R7309 Other abnormal glucose: Secondary | ICD-10-CM

## 2014-04-23 NOTE — Telephone Encounter (Signed)
-----   Message from [name] S Leonard, CNM sent at 04/23/2014  8:14 AM EDT ----- Notify patient that Hgb A1-C is still elevated at 6.0 this puts her at increase risk of developing Diabetes, feel she should been seen by PCP for management and follow up. If no PCP [facility].

## 2014-04-23 NOTE — Telephone Encounter (Signed)
Spoke with patient and message from Jennifer Nolan CNM given. Patient agreeable to referral. She currently has a PCP but would like to establish new care.[facility] and patient contacted with appointment.   Routing to provider for final review. Patient agreeable to disposition. Will close encounter

## 2014-06-12 ENCOUNTER — Other Ambulatory Visit: Payer: Self-pay

## 2014-06-12 DIAGNOSIS — Z1231 Encounter for screening mammogram for malignant neoplasm of breast: Secondary | ICD-10-CM

## 2014-06-18 ENCOUNTER — Ambulatory Visit
Admission: RE | Admit: 2014-06-18 | Discharge: 2014-06-18 | Disposition: A | Discharge status: Home / Self Care | Payer: 59 | Source: Ambulatory Visit

## 2014-06-18 DIAGNOSIS — Z1231 Encounter for screening mammogram for malignant neoplasm of breast: Secondary | ICD-10-CM

## 2014-10-08 ENCOUNTER — Other Ambulatory Visit: Payer: Self-pay | Admitting: Certified Nurse Midwife

## 2014-10-08 NOTE — Telephone Encounter (Signed)
Medication refill request: Junel Last AEX:  09-27-12  Next AEX: 10-20-14 Last MMG (if hormonal medication request): 06-19-14 WNL Refill authorized: please advise

## 2014-10-20 ENCOUNTER — Ambulatory Visit (INDEPENDENT_AMBULATORY_CARE_PROVIDER_SITE_OTHER): Payer: 59 | Admitting: Certified Nurse Midwife

## 2014-10-20 ENCOUNTER — Encounter: Payer: Self-pay | Admitting: Certified Nurse Midwife

## 2014-10-20 VITALS — BP 110/74 | HR 70 | Resp 16 | Ht 65.25 in | Wt 167.0 lb

## 2014-10-20 DIAGNOSIS — Z0000 Encounter for general adult medical examination without abnormal findings: Secondary | ICD-10-CM | POA: Diagnosis not present

## 2014-10-20 DIAGNOSIS — B009 Herpesviral infection, unspecified: Secondary | ICD-10-CM

## 2014-10-20 DIAGNOSIS — Z01419 Encounter for gynecological examination (general) (routine) without abnormal findings: Secondary | ICD-10-CM

## 2014-10-20 DIAGNOSIS — Z3041 Encounter for surveillance of contraceptive pills: Secondary | ICD-10-CM

## 2014-10-20 DIAGNOSIS — E049 Nontoxic goiter, unspecified: Secondary | ICD-10-CM

## 2014-10-20 LAB — HEMOGLOBIN, FINGERSTICK: Hemoglobin, fingerstick: 12.6 g/dL (ref 12.0–16.0)

## 2014-10-20 LAB — THYROID PANEL WITH TSH
Free Thyroxine Index: 2.8 (ref 1.4–3.8)
T3 Uptake: 26 % (ref 22–35)
T4, Total: 10.8 ug/dL (ref 4.5–12.0)
TSH: 0.873 u[IU]/mL (ref 0.350–4.500)

## 2014-10-20 MED ORDER — VALACYCLOVIR HCL 1 G PO TABS: 1000.0000 mg | ORAL_TABLET | Freq: Every day | ORAL | Status: DC

## 2014-10-20 MED ORDER — NORETHIN ACE-ETH ESTRAD-FE 1-20 MG-MCG(24) PO TABS: 1.0000 | ORAL_TABLET | Freq: Every day | ORAL | Status: DC

## 2014-10-20 NOTE — Progress Notes (Signed)
Encounter reviewed Jennifer Grout, MD   

## 2014-10-20 NOTE — Patient Instructions (Signed)

## 2014-10-20 NOTE — Progress Notes (Signed)
44 y.o. G1P1001 Married  African American Fe here for annual exam. Periods scant with minimal cramping, duration 1-2 days. Contraception working well for cycle control. Happy with choice after having menorrhagia earlier in the year.Marita Snellen practice if needed. Due for thyroid US again to monitor size and nodules. Working on weight control due to elevated Hgb A-1c. Has had Herpes outbreak on occasion this past year needs update on Rx. No other health issues today.  Patient's last menstrual period was 09/28/2014.          Sexually active: Yes.    The current method of family planning is OCP (estrogen/progesterone).    Exercising: No.  exercise Smoker:  no  Health Maintenance: Pap: 10-17-13 neg MMG: 06-18-14 category d density birads 1:neg Colonoscopy:  none BMD:   none TDaP:  2014 Labs: hgb-12.6 Self breast exam: done occ   reports that she has never smoked. She does not have any smokeless tobacco history on file. She reports that she drinks alcohol. She reports that she does not use illicit drugs.  Past Medical History  Diagnosis Date  . STD (sexually transmitted disease)     HSV2  . Abnormal Pap smear   . GERD (gastroesophageal reflux disease)   . Thyroid cyst     drained it    Past Surgical History  Procedure Laterality Date  . Biopsy thyroid      Current Outpatient Prescriptions  Medication Sig Dispense Refill  . cycloSPORINE (RESTASIS) 0.05 % ophthalmic emulsion 1 drop daily.     . JUNEL FE 24 1-20 MG-MCG(24) tablet TAKE 1 TABLET DAILY 28 tablet 1  . levocetirizine (XYZAL) 5 MG tablet daily.    Marland Kitchen NASONEX 50 MCG/ACT nasal spray daily.    . Prenatal Multivit-Min-Fe-FA (PRENATAL VITAMINS) 0.8 MG tablet Take 1 tablet by mouth daily. 90 tablet 4  . valACYclovir (VALTREX) 1000 MG tablet TAKE 1 TABLET (1000 MG TOTAL) AS NEEDED 90 tablet 4   No current facility-administered medications for this visit.    Family History  Problem Relation Age of Onset  . Hypertension  Mother   . Diabetes Mother   . Hypertension Maternal Grandmother     ROS:  Pertinent items are noted in HPI.  Otherwise, a comprehensive ROS was negative.  Exam:   BP 110/74 mmHg  Pulse 70  Resp 16  Ht 5' 5.25" (1.657 m)  Wt 167 lb (75.751 kg)  BMI 27.59 kg/m2  LMP 09/28/2014 Height: 5' 5.25" (165.7 cm) Ht Readings from Last 3 Encounters:  10/20/14 5' 5.25" (1.657 m)  01/27/14 5' 5.25" (1.657 m)  10/17/13 5' 5.25" (1.657 m)    General appearance: alert, cooperative and appears stated age Head: Normocephalic, without obvious abnormality, atraumatic Neck: no adenopathy, supple, symmetrical, trachea midline and thyroid enlarged and nodular Lungs: clear to auscultation bilaterally Breasts: normal appearance, no masses or tenderness, No nipple retraction or dimpling, No nipple discharge or bleeding, No axillary or supraclavicular adenopathy Heart: regular rate and rhythm Abdomen: soft, non-tender; no masses,  no organomegaly Extremities: extremities normal, atraumatic, no cyanosis or edema Skin: Skin color, texture, turgor normal. No rashes or lesions Lymph nodes: Cervical, supraclavicular, and axillary nodes normal. No abnormal inguinal nodes palpated Neurologic: Grossly normal   Pelvic: External genitalia:  no lesions              Urethra:  normal appearing urethra with no masses, tenderness or lesions  Bartholin's and Skene's: normal                 Vagina: normal appearing vagina with normal color and discharge, no lesions              Cervix: normal,non tender,no lesions              Pap taken: No. Bimanual Exam:  Uterus:  normal size, contour, position, consistency, mobility, non-tender              Adnexa: normal adnexa and no mass, fullness, tenderness               Rectovaginal: Confirms               Anus:  normal sphincter tone, no lesions  Chaperone present: yes  A:  Well Woman with normal exam  Contraception/cycle control OCP desired  Enlarged  thyroid with known nodules, needs [place] follow up  Herpes needs update on Valtrex Rx  Screening labs   P:   Reviewed health and wellness pertinent to exam  Rx Junel 1/20 FE see order  Patient will be scheduled for [place] of thyroid and called with information regarding appointment  Rx Valtrex see order  Lab: Hgb A1-C, Vitamin D, TSH with panel  Pap smear as above not taken   counseled on breast self exam, mammography screening, use and side effects of OCP's, adequate intake of calcium and vitamin D, diet and exercise  return annually or prn  An After Visit Summary was printed and given to the patient.

## 2014-10-21 LAB — HEMOGLOBIN A1C
Hgb A1c MFr Bld: 5.9 % — ABNORMAL HIGH (ref <5.7)
Mean Plasma Glucose: 123 mg/dL — ABNORMAL HIGH (ref <117)

## 2014-10-21 LAB — VITAMIN D 25 HYDROXY (VIT D DEFICIENCY, FRACTURES): Vit D, 25-Hydroxy: 23 ng/mL — ABNORMAL LOW (ref 30–100)

## 2014-12-02 ENCOUNTER — Telehealth: Payer: Self-pay

## 2014-12-02 NOTE — Telephone Encounter (Signed)
Spoke to patient. Patient said she was never called with an endocrine appointment. Please advise.

## 2014-12-02 NOTE — Telephone Encounter (Signed)
-----   Message from [name] S Leonard, CNM sent at 12/02/2014  8:14 AM EST ----- Patient needs call regarding keeping endocrine appointment for evaluation of thyroid. We referred and she has not followed up with. Is she doing this with PCP, if so will cancel referral.

## 2014-12-03 NOTE — Telephone Encounter (Signed)
See  Note regarding referral contact. Are we sure she was called?

## 2014-12-03 NOTE — Telephone Encounter (Signed)
Regarding patient referrals. She had two placed in her record. One for an US Thyroid with the result of "Per [facility], they attempted 3 calls to the patient, with time in between to allow for patients personal schedule, with no return calls" therefore the patient was not scheduled. A note was sent to D.Darcel BayleyLeonard for review and referral was closed. Also an internal medicine referral for elevated A1C. Per referral notes from previous referral coordinator, "Per Traci [facility], they have this patient in as a new patient." and was listed as having a scheduled appointment on 05/27/14 with Dr Jacky KindleAronson.

## 2014-12-08 NOTE — Telephone Encounter (Signed)
Please try to reach her again due being able to reach her regarding calls.

## 2014-12-18 NOTE — Telephone Encounter (Signed)
Clare GandyBecky, Deborah Leonard sent you a message regarding calling this patient again on 12-08-14. Please try to contact her if you havent already.

## 2014-12-22 ENCOUNTER — Other Ambulatory Visit: Payer: Self-pay | Admitting: Certified Nurse Midwife

## 2014-12-22 ENCOUNTER — Other Ambulatory Visit: Payer: Self-pay | Admitting: Internal Medicine

## 2014-12-22 DIAGNOSIS — E041 Nontoxic single thyroid nodule: Secondary | ICD-10-CM

## 2014-12-22 DIAGNOSIS — E042 Nontoxic multinodular goiter: Secondary | ICD-10-CM

## 2014-12-22 NOTE — Telephone Encounter (Signed)
Reviewed with patient that [facility] attempted to schedule. Patient states she did not receive any scheduling messages. Provided patient with contact information to [facility] and suggested she call for scheduling. Informed her if [facility] requires a new order, they may contact our office. Patient agreeable and states she will call. Patient states she did previously see Dr Link SnufferHolwerda for pre-diabetes follow up but no endocrinology referral. Reviewed with patient I would contact Dr Willeen CassBalan's office to schedule. Patient stated afternoons are best.   I do not see history of endocrinology referral entered. Please enter referral with diagnosis and I will be glad to schedule patient.  Patient agreeable to return call with appointment information.

## 2014-12-24 NOTE — Telephone Encounter (Signed)
Please close encounter when done.

## 2014-12-24 NOTE — Telephone Encounter (Signed)
Agree with plan 

## 2014-12-25 ENCOUNTER — Ambulatory Visit
Admission: RE | Admit: 2014-12-25 | Discharge: 2014-12-25 | Disposition: A | Discharge status: Home / Self Care | Payer: 59 | Source: Ambulatory Visit | Attending: Internal Medicine | Admitting: Internal Medicine

## 2014-12-25 DIAGNOSIS — E041 Nontoxic single thyroid nodule: Secondary | ICD-10-CM

## 2015-01-01 ENCOUNTER — Telehealth: Payer: Self-pay | Admitting: Certified Nurse Midwife

## 2015-01-01 NOTE — Telephone Encounter (Signed)
Call to patient to notify of referral Dr George HughEllison's office is attempting to schedule. Left voicemail to return call.

## 2015-01-01 NOTE — Telephone Encounter (Signed)
Patient is returning a call to [name]. °

## 2015-04-13 ENCOUNTER — Other Ambulatory Visit: Payer: Self-pay | Admitting: Certified Nurse Midwife

## 2015-04-14 NOTE — Telephone Encounter (Signed)
Medication refill request: Prenatal Vitamin  Last AEX:  10-20-14 Next AEX: 10-21-15 Last MMG (if hormonal medication request): 06-19-14 Refill authorized: please advise

## 2015-10-21 ENCOUNTER — Ambulatory Visit: Payer: 59 | Admitting: Certified Nurse Midwife

## 2015-10-21 ENCOUNTER — Ambulatory Visit (INDEPENDENT_AMBULATORY_CARE_PROVIDER_SITE_OTHER): Payer: 59 | Admitting: Certified Nurse Midwife

## 2015-10-21 ENCOUNTER — Encounter: Payer: Self-pay | Admitting: Certified Nurse Midwife

## 2015-10-21 VITALS — BP 122/78 | HR 72 | Resp 16 | Ht 65.25 in | Wt 172.0 lb

## 2015-10-21 DIAGNOSIS — Z0000 Encounter for general adult medical examination without abnormal findings: Secondary | ICD-10-CM

## 2015-10-21 DIAGNOSIS — Z01419 Encounter for gynecological examination (general) (routine) without abnormal findings: Secondary | ICD-10-CM

## 2015-10-21 DIAGNOSIS — Z124 Encounter for screening for malignant neoplasm of cervix: Secondary | ICD-10-CM

## 2015-10-21 DIAGNOSIS — E049 Nontoxic goiter, unspecified: Secondary | ICD-10-CM | POA: Diagnosis not present

## 2015-10-21 LAB — COMPREHENSIVE METABOLIC PANEL
ALT: 15 U/L (ref 6–29)
AST: 17 U/L (ref 10–35)
Albumin: 4 g/dL (ref 3.6–5.1)
Alkaline Phosphatase: 79 U/L (ref 33–115)
BUN: 9 mg/dL (ref 7–25)
CO2: 24 mmol/L (ref 20–31)
Calcium: 8.8 mg/dL (ref 8.6–10.2)
Chloride: 101 mmol/L (ref 98–110)
Creat: 0.88 mg/dL (ref 0.50–1.10)
Glucose, Bld: 77 mg/dL (ref 65–99)
Potassium: 4 mmol/L (ref 3.5–5.3)
Sodium: 135 mmol/L (ref 135–146)
Total Bilirubin: 0.5 mg/dL (ref 0.2–1.2)
Total Protein: 7.4 g/dL (ref 6.1–8.1)

## 2015-10-21 LAB — POCT URINALYSIS DIPSTICK
Bilirubin, UA: NEGATIVE
Blood, UA: NEGATIVE
Glucose, UA: NEGATIVE
Ketones, UA: NEGATIVE
Leukocytes, UA: NEGATIVE
Nitrite, UA: NEGATIVE
Protein, UA: NEGATIVE
Urobilinogen, UA: NEGATIVE
pH, UA: 5

## 2015-10-21 NOTE — Progress Notes (Signed)
45 y.o. 21P1001 Married  African American Fe here for annual exam. Periods normal, no issues. Contraception working well. Trying to work on exercise for weight management. Has not had mammogram yet but will schedule today. Sees Dr. Link SnufferHolwerda PCP prn, may change. One HSV outbreak in last year. Screening labs today. No other health issues today.  Patient's last menstrual period was 10/12/2015 (exact date).          Sexually active: Yes.    The current method of family planning is OCP (estrogen/progesterone).    Exercising: No.  exercise Smoker:  no  Health Maintenance: Pap:  10-17-13 neg MMG:  06-18-14 category d density birads 1:neg Colonoscopy:  none BMD:   none TDaP:  2014 Shingles: no Pneumonia: no Hep C and HIV: HIV neg yrs ago Labs: hgb-11.7 on PNV with iron Self breast exam: done occ   reports that she has never smoked. She has never used smokeless tobacco. She reports that she does not drink alcohol or use drugs.  Past Medical History:  Diagnosis Date  . Abnormal Pap smear   . GERD (gastroesophageal reflux disease)   . STD (sexually transmitted disease)    HSV2  . Thyroid cyst    drained it    Past Surgical History:  Procedure Laterality Date  . BIOPSY THYROID      Current Outpatient Prescriptions  Medication Sig Dispense Refill  . cycloSPORINE (RESTASIS) 0.05 % ophthalmic emulsion 1 drop daily.     . Norethindrone Acetate-Ethinyl Estrad-FE (JUNEL FE 24) 1-20 MG-MCG(24) tablet Take 1 tablet by mouth daily. 3 Package 4  . Prenatal Vit-Fe Fumarate-FA (PNV PRENATAL PLUS MULTIVITAMIN) 27-1 MG TABS TAKE 1 TABLET DAILY 90 tablet 2  . valACYclovir (VALTREX) 1000 MG tablet Take 1 tablet (1,000 mg total) by mouth daily. 90 tablet 4   No current facility-administered medications for this visit.     Family History  Problem Relation Age of Onset  . Hypertension Mother   . Diabetes Mother   . Hypertension Maternal Grandmother     ROS:  Pertinent items are noted in HPI.   Otherwise, a comprehensive ROS was negative.  Exam:   BP 122/78   Pulse 72   Resp 16   Ht 5' 5.25" (1.657 m)   Wt 172 lb (78 kg)   LMP 10/12/2015 (Exact Date)   BMI 28.40 kg/m  Height: 5' 5.25" (165.7 cm) Ht Readings from Last 3 Encounters:  10/21/15 5' 5.25" (1.657 m)  10/20/14 5' 5.25" (1.657 m)  01/27/14 5' 5.25" (1.657 m)    General appearance: alert, cooperative and appears stated age Head: Normocephalic, without obvious abnormality, atraumatic Neck: no adenopathy, supple, symmetrical, trachea midline and thyroid enlarged and questionable nodule palpated on right Lungs: clear to auscultation bilaterally Breasts: normal appearance, no masses or tenderness, No nipple retraction or dimpling, No nipple discharge or bleeding, No axillary or supraclavicular adenopathy Heart: regular rate and rhythm Abdomen: soft, non-tender; no masses,  no organomegaly Extremities: extremities normal, atraumatic, no cyanosis or edema Skin: Skin color, texture, turgor normal. No rashes or lesions Lymph nodes: Cervical, supraclavicular, and axillary nodes normal. No abnormal inguinal nodes palpated Neurologic: Grossly normal   Pelvic: External genitalia:  no lesions              Urethra:  normal appearing urethra with no masses, tenderness or lesions              Bartholin's and Skene's: normal  Vagina: normal appearing vagina with normal color and discharge, no lesions              Cervix: no bleeding following Pap, no cervical motion tenderness, no lesions and retroverted              Pap taken: Yes.   Bimanual Exam:  Uterus:  normal size, contour, position, consistency, mobility, non-tender              Adnexa: normal adnexa and no mass, fullness, tenderness               Rectovaginal: Confirms               Anus:  normal sphincter tone, no lesions  Chaperone present: yes  A:  Well Woman with normal exam  Contraception OCP desired  Enlarged thyroid  Screening  labs  Mammogram due patient to schedule today  History of HSV needs refill on Valtrex  P:   Reviewed health and wellness pertinent to exam  Rx Junel 24 see order  Discussed finding/etiology possibilities and need for evaluation with lab and possible [place]. Questions addressed at length Agreeable to plan.  Labs: Hgb A1-C,CMP,TSH with panel,Vitamin D  Discussed importance of routine mammogram and concerns with hormone use in OCP. Will renew for one month until in.  Pap smear as above with HPVHR   counseled on breast self exam, mammography screening, use and side effects of OCP's, adequate intake of calcium and vitamin D, diet and exercise  return annually or prn  An After Visit Summary was printed and given to the patient.

## 2015-10-21 NOTE — Patient Instructions (Signed)

## 2015-10-22 ENCOUNTER — Other Ambulatory Visit: Payer: Self-pay | Admitting: Certified Nurse Midwife

## 2015-10-22 DIAGNOSIS — E049 Nontoxic goiter, unspecified: Secondary | ICD-10-CM

## 2015-10-22 LAB — HEMOGLOBIN A1C
Hgb A1c MFr Bld: 5.6 % (ref <5.7)
Mean Plasma Glucose: 114 mg/dL

## 2015-10-22 LAB — IPS PAP TEST WITH HPV

## 2015-10-22 LAB — VITAMIN D 25 HYDROXY (VIT D DEFICIENCY, FRACTURES): Vit D, 25-Hydroxy: 24 ng/mL — ABNORMAL LOW (ref 30–100)

## 2015-10-22 LAB — THYROID PANEL WITH TSH
Free Thyroxine Index: 2.9 (ref 1.4–3.8)
T3 Uptake: 29 % (ref 22–35)
T4, Total: 9.9 ug/dL (ref 4.5–12.0)
TSH: 1.33 mIU/L

## 2015-10-22 LAB — HEMOGLOBIN, FINGERSTICK: Hemoglobin, fingerstick: 11.7 g/dL — ABNORMAL LOW (ref 12.0–16.0)

## 2015-10-24 NOTE — Progress Notes (Signed)
Encounter reviewed. Recommend thyroid ultrasound. Gertie ExonJill Jertson, MD

## 2015-10-26 NOTE — Progress Notes (Signed)
Order for imaging was placed after her labs came in. Waited on those in case needed to just to refer to endocrine.

## 2015-11-02 ENCOUNTER — Other Ambulatory Visit: Payer: Self-pay | Admitting: Certified Nurse Midwife

## 2015-11-02 DIAGNOSIS — Z3041 Encounter for surveillance of contraceptive pills: Secondary | ICD-10-CM

## 2015-11-03 ENCOUNTER — Other Ambulatory Visit: Payer: Self-pay | Admitting: Certified Nurse Midwife

## 2015-11-03 DIAGNOSIS — Z1231 Encounter for screening mammogram for malignant neoplasm of breast: Secondary | ICD-10-CM

## 2015-11-03 NOTE — Telephone Encounter (Signed)
Medication refill request: JUNEL FE 24 Last AEX:  10/21/15 DL Next AEX: 13/24/4008/11/02 Last MMG (if hormonal medication request): 06-18-14 category d density birads 1:neg Refill authorized: 10/20/14 #3packs w/4 refills; today #3packs w/4 refills?

## 2015-11-03 NOTE — Telephone Encounter (Signed)
Spoke with patient, she will be calling today to schedule MMG at [facility]

## 2015-11-03 NOTE — Telephone Encounter (Signed)
Patient was to have mammogram done so we could renew her OCP. Will need to call to see if done, no record in epic

## 2015-11-03 NOTE — Telephone Encounter (Signed)
Patient is returning a call to Taylor. °

## 2015-11-04 ENCOUNTER — Ambulatory Visit
Admission: RE | Admit: 2015-11-04 | Discharge: 2015-11-04 | Disposition: A | Discharge status: Home / Self Care | Payer: 59 | Source: Ambulatory Visit | Attending: Certified Nurse Midwife | Admitting: Certified Nurse Midwife

## 2015-11-04 DIAGNOSIS — E049 Nontoxic goiter, unspecified: Secondary | ICD-10-CM

## 2015-11-04 NOTE — Telephone Encounter (Signed)
Was this sent to patient's pharmacy? Please advise, thanks!

## 2015-11-04 NOTE — Telephone Encounter (Signed)
yes

## 2015-11-04 NOTE — Telephone Encounter (Signed)
One refill given

## 2015-11-08 NOTE — Telephone Encounter (Signed)
Still not seeing where this was sent. Could you please advise in absence of DL? Thanks!

## 2015-11-30 ENCOUNTER — Ambulatory Visit
Admission: RE | Admit: 2015-11-30 | Discharge: 2015-11-30 | Disposition: A | Discharge status: Home / Self Care | Payer: 59 | Source: Ambulatory Visit | Attending: Certified Nurse Midwife | Admitting: Certified Nurse Midwife

## 2015-11-30 DIAGNOSIS — Z1231 Encounter for screening mammogram for malignant neoplasm of breast: Secondary | ICD-10-CM

## 2016-02-05 ENCOUNTER — Other Ambulatory Visit: Payer: Self-pay | Admitting: Certified Nurse Midwife

## 2016-02-07 ENCOUNTER — Telehealth: Payer: Self-pay | Admitting: Certified Nurse Midwife

## 2016-02-07 DIAGNOSIS — R072 Precordial pain: Secondary | ICD-10-CM | POA: Diagnosis not present

## 2016-02-07 DIAGNOSIS — M25511 Pain in right shoulder: Secondary | ICD-10-CM | POA: Diagnosis not present

## 2016-02-07 NOTE — Telephone Encounter (Signed)
Medication refill request: Prenatal Vit-Fe Last AEX:  10/21/15 DL Next AEX: 95/62/1308/11/02 DL Last MMG (if hormonal medication request): 11/30/15 Blake [email or web address], Density D[facility] Refill authorized: 04/14/15 #90 2R. Please advise. Thank you.

## 2016-02-07 NOTE — Telephone Encounter (Signed)
Spoke with patient. Patient states she was in to see Leota SauersDeborah Leonard, CNM in October 2017 for AEX and was recommended 2 pcp. Advised patient can provide list of local pcp. Patient states she would like to know which 2 Mrs. Debbi recommends? Advised patient Leota SauersDeborah Leonard, CNM is out of the office today, can review when she returns 1/23 and return call. Patient states nothing urgent, can wait for return call tomorrow.  Leota Sauerseborah Leonard, CNM -do you remember who you recommended for pcp?

## 2016-02-07 NOTE — Telephone Encounter (Signed)
Jennifer BasqueHannah Nolan, Jennifer CourtsStacey Nolan

## 2016-02-07 NOTE — Telephone Encounter (Signed)
Patient said at her last appointment Jennifer Nolan, CNM has reccommended two female PCP's. Patient cannot remember the names of these PCP"s. Patient is hoping that Jennifer Nolan would remember the names.

## 2016-02-08 NOTE — Telephone Encounter (Signed)
Left detailed message, ok per current dpr. Advised Leota Sauerseborah Leonard, CNM recommended [facility] Krist[facility]annah [facility] and Dr. Joaquin CourtsStacey Blythe at [facility]. Advised to return call to [phone] for any additional questions.  Routing to provider for final review. Patient is agreeable to disposition. Will close encounter.

## 2016-02-16 DIAGNOSIS — Z23 Encounter for immunization: Secondary | ICD-10-CM | POA: Diagnosis not present

## 2016-05-27 DIAGNOSIS — Z79899 Other long term (current) drug therapy: Secondary | ICD-10-CM | POA: Diagnosis not present

## 2016-05-27 DIAGNOSIS — R55 Syncope and collapse: Secondary | ICD-10-CM | POA: Diagnosis not present

## 2016-05-27 DIAGNOSIS — R42 Dizziness and giddiness: Secondary | ICD-10-CM | POA: Diagnosis not present

## 2016-05-27 DIAGNOSIS — I10 Essential (primary) hypertension: Secondary | ICD-10-CM | POA: Diagnosis not present

## 2016-05-27 DIAGNOSIS — R000 Tachycardia, unspecified: Secondary | ICD-10-CM | POA: Diagnosis not present

## 2016-05-29 DIAGNOSIS — R030 Elevated blood-pressure reading, without diagnosis of hypertension: Secondary | ICD-10-CM | POA: Diagnosis not present

## 2016-05-30 DIAGNOSIS — N390 Urinary tract infection, site not specified: Secondary | ICD-10-CM | POA: Diagnosis not present

## 2016-05-30 DIAGNOSIS — Z0000 Encounter for general adult medical examination without abnormal findings: Secondary | ICD-10-CM | POA: Diagnosis not present

## 2016-06-05 DIAGNOSIS — R3129 Other microscopic hematuria: Secondary | ICD-10-CM | POA: Diagnosis not present

## 2016-06-05 DIAGNOSIS — Z0000 Encounter for general adult medical examination without abnormal findings: Secondary | ICD-10-CM | POA: Diagnosis not present

## 2016-06-05 DIAGNOSIS — R7309 Other abnormal glucose: Secondary | ICD-10-CM | POA: Diagnosis not present

## 2016-08-18 DIAGNOSIS — R309 Painful micturition, unspecified: Secondary | ICD-10-CM | POA: Diagnosis not present

## 2016-09-07 DIAGNOSIS — R309 Painful micturition, unspecified: Secondary | ICD-10-CM | POA: Diagnosis not present

## 2016-09-07 DIAGNOSIS — N390 Urinary tract infection, site not specified: Secondary | ICD-10-CM | POA: Diagnosis not present

## 2016-09-11 DIAGNOSIS — M25552 Pain in left hip: Secondary | ICD-10-CM | POA: Diagnosis not present

## 2016-09-25 ENCOUNTER — Other Ambulatory Visit: Payer: Self-pay | Admitting: Internal Medicine

## 2016-09-25 DIAGNOSIS — E041 Nontoxic single thyroid nodule: Secondary | ICD-10-CM | POA: Diagnosis not present

## 2016-09-28 ENCOUNTER — Ambulatory Visit
Admission: RE | Admit: 2016-09-28 | Discharge: 2016-09-28 | Disposition: A | Discharge status: Home / Self Care | Payer: 59 | Source: Ambulatory Visit | Attending: Internal Medicine | Admitting: Internal Medicine

## 2016-09-28 DIAGNOSIS — E042 Nontoxic multinodular goiter: Secondary | ICD-10-CM | POA: Diagnosis not present

## 2016-09-28 DIAGNOSIS — E041 Nontoxic single thyroid nodule: Secondary | ICD-10-CM

## 2016-10-16 DIAGNOSIS — R311 Benign essential microscopic hematuria: Secondary | ICD-10-CM | POA: Diagnosis not present

## 2016-10-16 DIAGNOSIS — N3000 Acute cystitis without hematuria: Secondary | ICD-10-CM | POA: Diagnosis not present

## 2016-10-24 ENCOUNTER — Encounter: Payer: Self-pay | Admitting: Certified Nurse Midwife

## 2016-10-24 ENCOUNTER — Ambulatory Visit (INDEPENDENT_AMBULATORY_CARE_PROVIDER_SITE_OTHER): Payer: 59 | Admitting: Certified Nurse Midwife

## 2016-10-24 VITALS — BP 150/82 | HR 77 | Resp 16 | Ht 65.25 in | Wt 180.0 lb

## 2016-10-24 DIAGNOSIS — Z3041 Encounter for surveillance of contraceptive pills: Secondary | ICD-10-CM | POA: Diagnosis not present

## 2016-10-24 DIAGNOSIS — Z01419 Encounter for gynecological examination (general) (routine) without abnormal findings: Secondary | ICD-10-CM | POA: Diagnosis not present

## 2016-10-24 DIAGNOSIS — I10 Essential (primary) hypertension: Secondary | ICD-10-CM

## 2016-10-24 NOTE — Progress Notes (Signed)
46 y.o. G1P1001 Married  African American Fe here for annual exam. Periods scant with OCP use, some increase in mucous only. No cramping with also. No missed pills. BP elevated with admission vital signs today. Patient denies headache, increase. Took OTC sinus medication last pm. Denies edema of hands or feet and no vision changes.  Sees Dr. Jarold Motto for aex and prn visits and labs. No concerns with increase in BP at last visit. No other health concerns today. Took trip to beach!  No LMP recorded. Patient is not currently having periods (Reason: Oral contraceptives).          Sexually active: Yes.    The current method of family planning is OCP (estrogen/progesterone).    Exercising: Yes.    walking & elliptical Smoker:  no  Health Maintenance: Pap:  10-17-13 neg, 10-21-15 neg HPV HR neg History of Abnormal Pap: yes MMG:  11-30-15 category d density birads 1:neg Self Breast exams: occ Colonoscopy:  none BMD:   none TDaP:  2014 Shingles: no Pneumonia: no Hep C and HIV: HIV neg yrs ago Labs: no   reports that she has never smoked. She has never used smokeless tobacco. She reports that she does not drink alcohol or use drugs.  Past Medical History:  Diagnosis Date  . Abnormal Pap smear   . GERD (gastroesophageal reflux disease)   . STD (sexually transmitted disease)    HSV2  . Thyroid cyst    drained it    Past Surgical History:  Procedure Laterality Date  . BIOPSY THYROID      Current Outpatient Prescriptions  Medication Sig Dispense Refill  . cycloSPORINE (RESTASIS) 0.05 % ophthalmic emulsion 1 drop daily.     . JUNEL FE 24 1-20 MG-MCG(24) tablet TAKE 1 TABLET DAILY 84 tablet 4  . pantoprazole (PROTONIX) 40 MG tablet Take 40 mg by mouth daily.    . Prenatal Vit-Fe Fumarate-FA (PNV PRENATAL PLUS MULTIVITAMIN) 27-1 MG TABS TAKE 1 TABLET DAILY 90 tablet 3  . valACYclovir (VALTREX) 1000 MG tablet Take 1 tablet (1,000 mg total) by mouth daily. 90 tablet 4   No current  facility-administered medications for this visit.     Family History  Problem Relation Age of Onset  . Hypertension Mother   . Diabetes Mother   . Hypertension Maternal Grandmother     ROS:  Pertinent items are noted in HPI.  Otherwise, a comprehensive ROS was negative.  Exam:   BP (!) 162/90   Pulse 72   Resp 16   Ht 5' 5.25" (1.657 m)   Wt 180 lb (81.6 kg)   BMI 29.72 kg/m  Height: 5' 5.25" (165.7 cm) Ht Readings from Last 3 Encounters:  10/24/16 5' 5.25" (1.657 m)  10/21/15 5' 5.25" (1.657 m)  10/20/14 5' 5.25" (1.657 m)    General appearance: alert, cooperative and appears stated age Head: Normocephalic, without obvious abnormality, atraumatic Neck: no adenopathy, supple, symmetrical, trachea midline and thyroid normal to inspection and palpation Lungs: clear to auscultation bilaterally Breasts: normal appearance, no masses or tenderness, No nipple retraction or dimpling, No nipple discharge or bleeding, No axillary or supraclavicular adenopathy Heart: regular rate and rhythm Abdomen: soft, non-tender; no masses,  no organomegaly Extremities: extremities normal, atraumatic, no cyanosis or edema Skin: Skin color, texture, turgor normal. No rashes or lesions Lymph nodes: Cervical, supraclavicular, and axillary nodes normal. No abnormal inguinal nodes palpated Neurologic: Grossly normal   Pelvic: External genitalia:  no lesions  Urethra:  normal appearing urethra with no masses, tenderness or lesions              Bartholin's and Skene's: normal                 Vagina: normal appearing vagina with normal color and discharge, no lesions              Cervix: multiparous appearance, no cervical motion tenderness and no lesions              Pap taken: No. Bimanual Exam:  Uterus:  normal size, contour, position, consistency, mobility, non-tender              Adnexa: normal adnexa and no mass, fullness, tenderness               Rectovaginal: Confirms                Anus:  normal sphincter tone, no lesions  Chaperone present: yes  A:  Well Woman with normal exam  Contraception OCP  Elevated BP on admission today, history of sinus medication last pm( no history of hypertension)  No HSV outbreaks  P:   Reviewed health and wellness pertinent to exam  Discussed stopping OCP at this point ( 2 more pills in pack until menses) and have period. Increase water intake and no more sinus medication. Decrease salt intake. Warning signs of Hypertension given and need to advise. Will recheck in 3 days, if continues elevation change OCP to POP and patient will follow up with PCP.  Valtrex Rx no refill needed  Pap smear: no   counseled on breast self exam, mammography screening, use and side effects of OCP's, adequate intake of calcium and vitamin D, diet and exercise  return annually or prn  An After Visit Summary was printed and given to the patient.

## 2016-10-24 NOTE — Patient Instructions (Signed)

## 2016-10-25 ENCOUNTER — Ambulatory Visit: Payer: 59 | Admitting: Certified Nurse Midwife

## 2016-10-27 ENCOUNTER — Encounter: Payer: Self-pay | Admitting: Certified Nurse Midwife

## 2016-10-27 ENCOUNTER — Ambulatory Visit (INDEPENDENT_AMBULATORY_CARE_PROVIDER_SITE_OTHER): Payer: 59 | Admitting: Certified Nurse Midwife

## 2016-10-27 VITALS — BP 130/80 | HR 70 | Resp 16 | Ht 65.25 in | Wt 181.0 lb

## 2016-10-27 DIAGNOSIS — Z8742 Personal history of other diseases of the female genital tract: Secondary | ICD-10-CM

## 2016-10-27 DIAGNOSIS — I159 Secondary hypertension, unspecified: Secondary | ICD-10-CM

## 2016-10-27 DIAGNOSIS — Z308 Encounter for other contraceptive management: Secondary | ICD-10-CM

## 2016-10-27 NOTE — Progress Notes (Signed)
Subjective:     Patient ID: Jennifer Nolan, female   DOB: [birth year], 46 y.o.   MRN: [id number]  Patient returns today for BP recheck after elevation noted at aex 10/24/16. She stopped her contraception Junel Fe 24 . She also stopped sinus medication she had taken the night before her exam. She has been checking her BP at home and remains elevated. She denies blurred vision or edema of feet or hands. Has noted dull headache, but not sure if sinus or stopping OCP. Has not needed pain medication for. Had started OCP originally due to heavy periods with anemia occurring. Previous use of diaphragm. She can go back to that for contraception, but is worried about the heavy bleeding returning.  She is aware of the importance of hypertension control as father has this. Does want to proactive in taking care of herself. No other concerns today.      Review of Systems  Constitutional: Negative.   HENT: Negative.   Eyes: Negative.   Respiratory: Negative.   Cardiovascular: Negative.   Gastrointestinal: Negative.   Endocrine: Negative.   Genitourinary: Negative.   Musculoskeletal: Negative.   Allergic/Immunologic: Negative for environmental allergies.  Neurological: Positive for headaches.       Slight headache,no medication  Psychiatric/Behavioral: Negative.        Objective:   Physical Exam  Constitutional: She appears well-developed and well-nourished.  Skin: Skin is dry.  Psychiatric: She has a normal mood and affect. Her behavior is normal. Judgment and thought content normal.  BP: 130/80, previous 150/82 10/24/16 2017: 122/78, 2016: 110/74     Assessment:     Menorrhagia history with anemia, contraception OCP use with good cycle control Borderline hypertension now with slow increase of BP, remains elevated with stopping OCP Family history of hypertension    Plan:     Discussed risk of continuance of OCP or POP with hypertension and needs to discontinue use. Discussed other  contraceptive options and not sure which direction she will go with. Given option information . Also discussed Ablation for menorrhagia history, but would also need contraception. Patient will use condoms at present and advise of choice or if more information needed. Discussed need to contact her PCP Dr. Link Snuffer regarding BP change and have OV for management. Patient will call and go in.  Declined assistance with scheduling. Warning signs of hypertension given. Work on decrease salt and boxed or fast foods. Increase lean protein and vegetables in diet. Good water intake.  Rv prn  Time spent in consultation 20 minutes

## 2016-10-30 DIAGNOSIS — R311 Benign essential microscopic hematuria: Secondary | ICD-10-CM | POA: Diagnosis not present

## 2016-10-30 DIAGNOSIS — R3129 Other microscopic hematuria: Secondary | ICD-10-CM | POA: Diagnosis not present

## 2016-11-01 DIAGNOSIS — I10 Essential (primary) hypertension: Secondary | ICD-10-CM | POA: Diagnosis not present

## 2016-11-06 DIAGNOSIS — R3121 Asymptomatic microscopic hematuria: Secondary | ICD-10-CM | POA: Diagnosis not present

## 2016-11-06 DIAGNOSIS — N3000 Acute cystitis without hematuria: Secondary | ICD-10-CM | POA: Diagnosis not present

## 2016-11-07 DIAGNOSIS — R300 Dysuria: Secondary | ICD-10-CM | POA: Diagnosis not present

## 2016-11-11 DIAGNOSIS — Z23 Encounter for immunization: Secondary | ICD-10-CM | POA: Diagnosis not present

## 2016-11-22 DIAGNOSIS — H04129 Dry eye syndrome of unspecified lacrimal gland: Secondary | ICD-10-CM | POA: Diagnosis not present

## 2016-11-23 ENCOUNTER — Encounter: Payer: Self-pay | Admitting: Certified Nurse Midwife

## 2016-11-30 DIAGNOSIS — I10 Essential (primary) hypertension: Secondary | ICD-10-CM | POA: Diagnosis not present

## 2016-12-20 DIAGNOSIS — E042 Nontoxic multinodular goiter: Secondary | ICD-10-CM | POA: Diagnosis not present

## 2016-12-20 DIAGNOSIS — E049 Nontoxic goiter, unspecified: Secondary | ICD-10-CM | POA: Diagnosis not present

## 2017-01-01 ENCOUNTER — Other Ambulatory Visit: Payer: Self-pay | Admitting: Certified Nurse Midwife

## 2017-01-01 DIAGNOSIS — Z1231 Encounter for screening mammogram for malignant neoplasm of breast: Secondary | ICD-10-CM

## 2017-01-04 ENCOUNTER — Ambulatory Visit
Admission: RE | Admit: 2017-01-04 | Discharge: 2017-01-04 | Disposition: A | Discharge status: Home / Self Care | Payer: 59 | Source: Ambulatory Visit | Attending: Certified Nurse Midwife | Admitting: Certified Nurse Midwife

## 2017-01-04 DIAGNOSIS — Z1231 Encounter for screening mammogram for malignant neoplasm of breast: Secondary | ICD-10-CM | POA: Diagnosis not present

## 2017-02-14 DIAGNOSIS — I10 Essential (primary) hypertension: Secondary | ICD-10-CM | POA: Diagnosis not present

## 2017-03-21 ENCOUNTER — Other Ambulatory Visit: Payer: Self-pay | Admitting: Certified Nurse Midwife

## 2017-03-21 ENCOUNTER — Telehealth: Payer: Self-pay | Admitting: Certified Nurse Midwife

## 2017-03-21 DIAGNOSIS — B009 Herpesviral infection, unspecified: Secondary | ICD-10-CM

## 2017-03-21 MED ORDER — PNV PRENATAL PLUS MULTIVITAMIN 27-1 MG PO TABS: 1.0000 | ORAL_TABLET | Freq: Every day | ORAL | 3 refills | Status: DC

## 2017-03-21 MED ORDER — VALACYCLOVIR HCL 1 G PO TABS: 1000.0000 mg | ORAL_TABLET | Freq: Every day | ORAL | 4 refills | Status: DC

## 2017-03-21 NOTE — Telephone Encounter (Signed)
Ok to refill 

## 2017-03-21 NOTE — Telephone Encounter (Signed)
Prescriptions sent to [facility]. Encounter closed.

## 2017-03-21 NOTE — Telephone Encounter (Signed)
Prescription pended. Please approve for quantity and refill amount.

## 2017-03-21 NOTE — Telephone Encounter (Signed)
Medication refill request: Valtrex  Last AEX:  10/24/16 DL  Next AEX: not currently scheduled  Last MMG (if hormonal medication request): 01/04/17 BIRADS 1 negative  Refill authorized: 10/20/14 #90, 4RF. Today, please advise.   Medication refill request: Prenatal Vitamin  Refill authorized: 02/07/16 #90, 3RF. Today, please advise.

## 2017-03-21 NOTE — Telephone Encounter (Signed)
Patient is calling to get her prescriptions sent to her new mail order pharmacy. She states her prescriptions have expired for Valtrex and the prenatal vitamin. She is now using [facility].

## 2017-06-01 DIAGNOSIS — E041 Nontoxic single thyroid nodule: Secondary | ICD-10-CM | POA: Diagnosis not present

## 2017-06-01 DIAGNOSIS — Z0000 Encounter for general adult medical examination without abnormal findings: Secondary | ICD-10-CM | POA: Diagnosis not present

## 2017-06-01 DIAGNOSIS — R82998 Other abnormal findings in urine: Secondary | ICD-10-CM | POA: Diagnosis not present

## 2017-06-06 DIAGNOSIS — E041 Nontoxic single thyroid nodule: Secondary | ICD-10-CM | POA: Diagnosis not present

## 2017-06-06 DIAGNOSIS — I10 Essential (primary) hypertension: Secondary | ICD-10-CM | POA: Diagnosis not present

## 2017-06-06 DIAGNOSIS — Z1389 Encounter for screening for other disorder: Secondary | ICD-10-CM | POA: Diagnosis not present

## 2017-06-06 DIAGNOSIS — Z0000 Encounter for general adult medical examination without abnormal findings: Secondary | ICD-10-CM | POA: Diagnosis not present

## 2017-06-06 DIAGNOSIS — R002 Palpitations: Secondary | ICD-10-CM | POA: Diagnosis not present

## 2017-10-11 DIAGNOSIS — I10 Essential (primary) hypertension: Secondary | ICD-10-CM | POA: Diagnosis not present

## 2017-10-11 DIAGNOSIS — Z23 Encounter for immunization: Secondary | ICD-10-CM | POA: Diagnosis not present

## 2017-10-11 DIAGNOSIS — M5136 Other intervertebral disc degeneration, lumbar region: Secondary | ICD-10-CM | POA: Diagnosis not present

## 2017-11-12 DIAGNOSIS — N390 Urinary tract infection, site not specified: Secondary | ICD-10-CM | POA: Diagnosis not present

## 2017-11-12 DIAGNOSIS — R300 Dysuria: Secondary | ICD-10-CM | POA: Diagnosis not present

## 2017-12-11 ENCOUNTER — Encounter: Payer: Self-pay | Admitting: Certified Nurse Midwife

## 2017-12-11 ENCOUNTER — Other Ambulatory Visit: Payer: Self-pay

## 2017-12-11 ENCOUNTER — Ambulatory Visit (INDEPENDENT_AMBULATORY_CARE_PROVIDER_SITE_OTHER): Payer: 59 | Admitting: Certified Nurse Midwife

## 2017-12-11 VITALS — BP 110/68 | HR 68 | Resp 16 | Ht 65.25 in | Wt 179.0 lb

## 2017-12-11 DIAGNOSIS — E049 Nontoxic goiter, unspecified: Secondary | ICD-10-CM

## 2017-12-11 DIAGNOSIS — Z30011 Encounter for initial prescription of contraceptive pills: Secondary | ICD-10-CM

## 2017-12-11 DIAGNOSIS — Z01419 Encounter for gynecological examination (general) (routine) without abnormal findings: Secondary | ICD-10-CM | POA: Diagnosis not present

## 2017-12-11 DIAGNOSIS — N912 Amenorrhea, unspecified: Secondary | ICD-10-CM

## 2017-12-11 DIAGNOSIS — Z3202 Encounter for pregnancy test, result negative: Secondary | ICD-10-CM

## 2017-12-11 LAB — POCT URINE PREGNANCY: Preg Test, Ur: NEGATIVE

## 2017-12-11 MED ORDER — NORETHINDRONE 0.35 MG PO TABS: 1.0000 | ORAL_TABLET | Freq: Every day | ORAL | 3 refills | Status: DC

## 2017-12-11 NOTE — Patient Instructions (Signed)

## 2017-12-11 NOTE — Progress Notes (Signed)
47 y.o. G1P1001 Married  African American Fe here for annual exam. Periods are becoming longer and heavier, since stopping pills. Contraception none, spouse has ED and sexual activity is limited. Interested in POP for contraception. No HSV outbreaks in past year. Sees Dr. Link SnufferHolwerda for aex and labs, hypertension, GERD management. No health issues today.  Patient's last menstrual period was 10/29/2017 (exact date).          Sexually active: Yes.    The current method of family planning is none.    Exercising: No.  exercise Smoker:  no  Review of Systems  Constitutional: Negative.   HENT: Negative.   Eyes: Negative.   Respiratory: Negative.   Cardiovascular: Negative.   Gastrointestinal: Negative.   Genitourinary:       Irregular cycle  Musculoskeletal: Negative.   Skin: Negative.   Neurological: Negative.   Endo/Heme/Allergies: Negative.   Psychiatric/Behavioral: Negative.     Health Maintenance: Pap:  10-21-15 neg HPV HR neg History of Abnormal Pap: yes MMG:  01-04-17 category d density birads 1:neg Self Breast exams: occ Colonoscopy:  none BMD:   none TDaP:  2014 Shingles: no Pneumonia: no Hep C and HIV: HIV neg 2019 per patient Labs: upt-neg   reports that she has never smoked. She has never used smokeless tobacco. She reports that she does not drink alcohol or use drugs.  Past Medical History:  Diagnosis Date  . Abnormal Pap smear   . Arthritis    in spine  . GERD (gastroesophageal reflux disease)   . Hypertension   . STD (sexually transmitted disease)    HSV2  . Thyroid cyst    drained it    Past Surgical History:  Procedure Laterality Date  . BIOPSY THYROID      Current Outpatient Medications  Medication Sig Dispense Refill  . cycloSPORINE (RESTASIS) 0.05 % ophthalmic emulsion 1 drop daily.     . hydrochlorothiazide (HYDRODIURIL) 25 MG tablet TK 1 T PO D FOR BP    . methocarbamol (ROBAXIN) 500 MG tablet   3  . pantoprazole (PROTONIX) 40 MG tablet Take  40 mg by mouth daily.    . Prenatal Vit-Fe Fumarate-FA (PNV PRENATAL PLUS MULTIVITAMIN) 27-1 MG TABS Take 1 tablet by mouth daily. 90 tablet 3  . valACYclovir (VALTREX) 1000 MG tablet Take 1 tablet (1,000 mg total) by mouth daily. 90 tablet 4   No current facility-administered medications for this visit.     Family History  Problem Relation Age of Onset  . Hypertension Mother   . Diabetes Mother   . Hypertension Maternal Grandmother   . Breast cancer Neg Hx     ROS:  Pertinent items are noted in HPI.  Otherwise, a comprehensive ROS was negative.  Exam:   BP 110/68   Pulse 68   Resp 16   Ht 5' 5.25" (1.657 m)   Wt 179 lb (81.2 kg)   LMP 10/29/2017 (Exact Date)   BMI 29.56 kg/m  Height: 5' 5.25" (165.7 cm) Ht Readings from Last 3 Encounters:  12/11/17 5' 5.25" (1.657 m)  10/27/16 5' 5.25" (1.657 m)  10/24/16 5' 5.25" (1.657 m)    General appearance: alert, cooperative and appears stated age Head: Normocephalic, without obvious abnormality, atraumatic Neck: no adenopathy, supple, symmetrical, trachea midline and thyroid normal to inspection and palpation and mild enlargement noted Lungs: clear to auscultation bilaterally Breasts: normal appearance, no masses or tenderness, No nipple retraction or dimpling, No nipple discharge or bleeding, No axillary or  supraclavicular adenopathy Heart: regular rate and rhythm Abdomen: soft, non-tender; no masses,  no organomegaly Extremities: extremities normal, atraumatic, no cyanosis or edema Skin: Skin color, texture, turgor normal. No rashes or lesions Lymph nodes: Cervical, supraclavicular, and axillary nodes normal. No abnormal inguinal nodes palpated Neurologic: Grossly normal   Pelvic: External genitalia:  no lesions              Urethra:  normal appearing urethra with no masses, tenderness or lesions              Bartholin's and Skene's: normal                 Vagina: normal appearing vagina with normal color and discharge, no  lesions              Cervix: no cervical motion tenderness, no lesions and normal appearance              Pap taken: No. Bimanual Exam:  Uterus:  normal size, contour, position, consistency, mobility, non-tender and anteverted              Adnexa: normal adnexa and no mass, fullness, tenderness               Rectovaginal: Confirms               Anus:  normal sphincter tone, no lesions  Chaperone present: yes  A:  Well Woman with normal exam  Contraception desired that is safe with hypertension  Hypertension management with PCP  Colonoscopy due  P:   Reviewed health and wellness pertinent to exam  Discussed risks/benefits/warning signs and bleeding expectations of POP. Desires trial.  Rx Micronor see order with instructions, begin with next period, advise if 3 months if problems  Continue follow up with PCP as indicated.  Discussed risks/benefits of cologard, colonoscopy. Patient will advise if she wants to proceed with either option. May want to discuss with PCP.  Pap smear: no  counseled on breast self exam, mammography screening, feminine hygiene, adequate intake of calcium and vitamin D, diet and exercise  return annually or prn  An After Visit Summary was printed and given to the patient.

## 2017-12-27 ENCOUNTER — Other Ambulatory Visit: Payer: Self-pay | Admitting: Certified Nurse Midwife

## 2017-12-27 DIAGNOSIS — Z1231 Encounter for screening mammogram for malignant neoplasm of breast: Secondary | ICD-10-CM

## 2018-01-29 ENCOUNTER — Ambulatory Visit
Admission: RE | Admit: 2018-01-29 | Discharge: 2018-01-29 | Disposition: A | Discharge status: Home / Self Care | Payer: 59 | Source: Ambulatory Visit | Attending: Certified Nurse Midwife | Admitting: Certified Nurse Midwife

## 2018-01-29 DIAGNOSIS — Z1231 Encounter for screening mammogram for malignant neoplasm of breast: Secondary | ICD-10-CM | POA: Diagnosis not present

## 2018-02-14 DIAGNOSIS — L731 Pseudofolliculitis barbae: Secondary | ICD-10-CM | POA: Insufficient documentation

## 2018-06-04 DIAGNOSIS — Z0000 Encounter for general adult medical examination without abnormal findings: Secondary | ICD-10-CM | POA: Diagnosis not present

## 2018-06-04 DIAGNOSIS — E041 Nontoxic single thyroid nodule: Secondary | ICD-10-CM | POA: Diagnosis not present

## 2018-06-06 DIAGNOSIS — R82998 Other abnormal findings in urine: Secondary | ICD-10-CM | POA: Diagnosis not present

## 2018-06-06 DIAGNOSIS — I10 Essential (primary) hypertension: Secondary | ICD-10-CM | POA: Diagnosis not present

## 2018-07-11 ENCOUNTER — Other Ambulatory Visit: Payer: Self-pay | Admitting: Certified Nurse Midwife

## 2018-07-11 NOTE — Telephone Encounter (Signed)
Need to check with patient and make sure she is not taking another MVI Theragram is listed, can not do both. Also check and see if she obtained refill for POP if needed. Did not see where refilled after mammogram

## 2018-07-11 NOTE — Telephone Encounter (Signed)
Medication refill request: Prenatal Vitamin  Last AEX:  12-11-17 DL  Next AEX: 12-18-2018 Last MMG (if hormonal medication request): n/a Refill authorized: 03-22-2018 #90, 3RF. Today, please refill.   Medication pended for #90, 0RF. Please refill if appropriate.

## 2018-07-11 NOTE — Telephone Encounter (Signed)
Spoke with patient. Patient states she is not taking MVI Theragram, only the PNV. States she has enough refills of POP. RN advised would send request to Debbi for review. Patient agreeable. Pharmacy confirmed as [facility].

## 2018-10-26 ENCOUNTER — Other Ambulatory Visit: Payer: Self-pay | Admitting: Certified Nurse Midwife

## 2018-10-26 DIAGNOSIS — B009 Herpesviral infection, unspecified: Secondary | ICD-10-CM

## 2018-10-28 NOTE — Telephone Encounter (Signed)
Medication refill request: Valtrex Last AEX:  12/11/2017 DL Next AEX: 12/18/2018 Last MMG (if hormonal medication request): 01/29/2018 BIRADS 1 Negative Density D Refill authorized: Pending #90 with no refills if appropriate. Please advise.

## 2018-12-16 ENCOUNTER — Other Ambulatory Visit: Payer: Self-pay

## 2018-12-18 ENCOUNTER — Encounter: Payer: Self-pay | Admitting: Certified Nurse Midwife

## 2018-12-18 ENCOUNTER — Other Ambulatory Visit: Payer: Self-pay

## 2018-12-18 ENCOUNTER — Other Ambulatory Visit (HOSPITAL_COMMUNITY)
Admission: RE | Admit: 2018-12-18 | Discharge: 2018-12-18 | Disposition: A | Discharge status: Home / Self Care | Payer: 59 | Source: Ambulatory Visit | Attending: Obstetrics & Gynecology | Admitting: Obstetrics & Gynecology

## 2018-12-18 ENCOUNTER — Ambulatory Visit (INDEPENDENT_AMBULATORY_CARE_PROVIDER_SITE_OTHER): Payer: 59 | Admitting: Certified Nurse Midwife

## 2018-12-18 VITALS — BP 120/80 | HR 70 | Temp 97.1°F | Resp 16 | Ht 65.25 in | Wt 181.0 lb

## 2018-12-18 DIAGNOSIS — Z0000 Encounter for general adult medical examination without abnormal findings: Secondary | ICD-10-CM | POA: Diagnosis not present

## 2018-12-18 DIAGNOSIS — Z01419 Encounter for gynecological examination (general) (routine) without abnormal findings: Secondary | ICD-10-CM

## 2018-12-18 DIAGNOSIS — N912 Amenorrhea, unspecified: Secondary | ICD-10-CM | POA: Diagnosis not present

## 2018-12-18 DIAGNOSIS — B009 Herpesviral infection, unspecified: Secondary | ICD-10-CM

## 2018-12-18 DIAGNOSIS — Z124 Encounter for screening for malignant neoplasm of cervix: Secondary | ICD-10-CM | POA: Diagnosis not present

## 2018-12-18 LAB — POCT URINALYSIS DIPSTICK
Bilirubin, UA: NEGATIVE
Blood, UA: NEGATIVE
Glucose, UA: NEGATIVE
Ketones, UA: NEGATIVE
Leukocytes, UA: NEGATIVE
Nitrite, UA: NEGATIVE
Protein, UA: NEGATIVE
Urobilinogen, UA: NEGATIVE E.U./dL — AB
pH, UA: 5 (ref 5.0–8.0)

## 2018-12-18 LAB — POCT URINE PREGNANCY: Preg Test, Ur: NEGATIVE

## 2018-12-18 MED ORDER — VALACYCLOVIR HCL 1 G PO TABS: 1000.0000 mg | ORAL_TABLET | Freq: Every day | ORAL | 3 refills | Status: DC

## 2018-12-18 NOTE — Patient Instructions (Signed)
EXERCISE AND DIET:  We recommended that you start or continue a regular exercise program for good health. Regular exercise means any activity that makes your heart beat faster and makes you sweat.  We recommend exercising at least 30 minutes per day at least 3 days a week, preferably 4 or 5.  We also recommend a diet low in fat and sugar.  Inactivity, poor dietary choices and obesity can cause diabetes, heart attack, stroke, and kidney damage, among others.    ALCOHOL AND SMOKING:  Women should limit their alcohol intake to no more than 7 drinks/beers/glasses of wine (combined, not each!) per week. Moderation of alcohol intake to this level decreases your risk of breast cancer and liver damage. And of course, no recreational drugs are part of a healthy lifestyle.  And absolutely no smoking or even second hand smoke. Most people know smoking can cause heart and lung diseases, but did you know it also contributes to weakening of your bones? Aging of your skin?  Yellowing of your teeth and nails?  CALCIUM AND VITAMIN D:  Adequate intake of calcium and Vitamin D are recommended.  The recommendations for exact amounts of these supplements seem to change often, but generally speaking 600 mg of calcium (either carbonate or citrate) and 800 units of Vitamin D per day seems prudent. Certain women may benefit from higher intake of Vitamin D.  If you are among these women, your doctor will have told you during your visit.    PAP SMEARS:  Pap smears, to check for cervical cancer or precancers,  have traditionally been done yearly, although recent scientific advances have shown that most women can have pap smears less often.  However, every woman still should have a physical exam from her gynecologist every year. It will include a breast check, inspection of the vulva and vagina to check for abnormal growths or skin changes, a visual exam of the cervix, and then an exam to evaluate the size and shape of the uterus and  ovaries.  And after 48 years of age, a rectal exam is indicated to check for rectal cancers. We will also provide age appropriate advice regarding health maintenance, like when you should have certain vaccines, screening for sexually transmitted diseases, bone density testing, colonoscopy, mammograms, etc.   MAMMOGRAMS:  All women over 40 years old should have a yearly mammogram. Many facilities now offer a "3D" mammogram, which may cost around $50 extra out of pocket. If possible,  we recommend you accept the option to have the 3D mammogram performed.  It both reduces the number of women who will be called back for extra views which then turn out to be normal, and it is better than the routine mammogram at detecting truly abnormal areas.    COLONOSCOPY:  Colonoscopy to screen for colon cancer is recommended for all women at age 50.  We know, you hate the idea of the prep.  We agree, BUT, having colon cancer and not knowing it is worse!!  Colon cancer so often starts as a polyp that can be seen and removed at colonscopy, which can quite literally save your life!  And if your first colonoscopy is normal and you have no family history of colon cancer, most women don't have to have it again for 10 years.  Once every ten years, you can do something that may end up saving your life, right?  We will be happy to help you get it scheduled when you are ready.    Be sure to check your insurance coverage so you understand how much it will cost.  It may be covered as a preventative service at no cost, but you should check your particular policy.      Perimenopause  Perimenopause is the normal time of life before and after menstrual periods stop completely (menopause). Perimenopause can begin 2-8 years before menopause, and it usually lasts for 1 year after menopause. During perimenopause, the ovaries may or may not produce an egg. What are the causes? This condition is caused by a natural change in hormone levels that  happens as you get older. What increases the risk? This condition is more likely to start at an earlier age if you have certain medical conditions or treatments, including:  A tumor of the pituitary gland in the brain.  A disease that affects the ovaries and hormone production.  Radiation treatment for cancer.  Certain cancer treatments, such as chemotherapy or hormone (anti-estrogen) therapy.  Heavy smoking and excessive alcohol use.  Family history of early menopause. What are the signs or symptoms? Perimenopausal changes affect each woman differently. Symptoms of this condition may include:  Hot flashes.  Night sweats.  Irregular menstrual periods.  Decreased sex drive.  Vaginal dryness.  Headaches.  Mood swings.  Depression.  Memory problems or trouble concentrating.  Irritability.  Tiredness.  Weight gain.  Anxiety.  Trouble getting pregnant. How is this diagnosed? This condition is diagnosed based on your medical history, a physical exam, your age, your menstrual history, and your symptoms. Hormone tests may also be done. How is this treated? In some cases, no treatment is needed. You and your health care provider should make a decision together about whether treatment is necessary. Treatment will be based on your individual condition and preferences. Various treatments are available, such as:  Menopausal hormone therapy (MHT).  Medicines to treat specific symptoms.  Acupuncture.  Vitamin or herbal supplements. Before starting treatment, make sure to let your health care provider know if you have a personal or family history of:  Heart disease.  Breast cancer.  Blood clots.  Diabetes.  Osteoporosis. Follow these instructions at home: Lifestyle  Do not use any products that contain nicotine or tobacco, such as cigarettes and e-cigarettes. If you need help quitting, ask your health care provider.  Eat a balanced diet that includes fresh  fruits and vegetables, whole grains, soybeans, eggs, lean meat, and low-fat dairy.  Get at least 30 minutes of physical activity on 5 or more days each week.  Avoid alcoholic and caffeinated beverages, as well as spicy foods. This may help prevent hot flashes.  Get 7-8 hours of sleep each night.  Dress in layers that can be removed to help you manage hot flashes.  Find ways to manage stress, such as deep breathing, meditation, or journaling. General instructions  Keep track of your menstrual periods, including: ? When they occur. ? How heavy they are and how long they last. ? How much time passes between periods.  Keep track of your symptoms, noting when they start, how often you have them, and how long they last.  Take over-the-counter and prescription medicines only as told by your health care provider.  Take vitamin supplements only as told by your health care provider. These may include calcium, vitamin E, and vitamin D.  Use vaginal lubricants or moisturizers to help with vaginal dryness and improve comfort during sex.  Talk with your health care provider before starting any herbal supplements.    Keep all follow-up visits as told by your health care provider. This is important. This includes any group therapy or counseling. Contact a health care provider if:  You have heavy vaginal bleeding or pass blood clots.  Your period lasts more than 2 days longer than normal.  Your periods are recurring sooner than 21 days.  You bleed after having sex. Get help right away if:  You have chest pain, trouble breathing, or trouble talking.  You have severe depression.  You have pain when you urinate.  You have severe headaches.  You have vision problems. Summary  Perimenopause is the time when a woman's body begins to move into menopause. This may happen naturally or as a result of other health problems or medical treatments.  Perimenopause can begin 2-8 years before  menopause, and it usually lasts for 1 year after menopause.  Perimenopausal symptoms can be managed through medicines, lifestyle changes, and complementary therapies such as acupuncture. This information is not intended to replace advice given to you by your health care provider. Make sure you discuss any questions you have with your health care provider. Document Released: 02/10/2004 Document Revised: 12/15/2016 Document Reviewed: 02/08/2016 Elsevier Patient Education  2020 [employer] Inc[employer]  

## 2018-12-18 NOTE — Progress Notes (Signed)
48 y.o. G61P1001 Married  African American Fe here for annual exam. Contraception none, stopped Micronor due to fatigue feeling. Not very sexually active and aware pregnancy is possible. Sees Dr Link Snuffer for aex,labs, hypertension management. All stable. No health issues today.  Patient's last menstrual period was 11/17/2018 (exact date).          Sexually active: Yes.    The current method of family planning is none.    Exercising: No.  exercise Smoker:  no  Review of Systems  Constitutional: Negative.   HENT: Negative.   Eyes: Negative.   Respiratory: Negative.   Cardiovascular: Negative.   Gastrointestinal: Negative.   Genitourinary: Positive for dysuria.  Musculoskeletal: Negative.   Skin:       Ingrown hair/boil  Neurological: Negative.   Endo/Heme/Allergies: Negative.   Psychiatric/Behavioral: Negative.     Health Maintenance: Pap:  10-21-15 neg HPV HR neg History of Abnormal Pap: yes MMG:  01-29-2018 category d density birads 1:neg Self Breast exams: yes Colonoscopy:  none BMD:   none TDaP:  2014 Shingles: no Pneumonia: no Hep C and HIV: HIV neg 2019 per patient Labs: poct urine-neg, upt-neg   reports that she has never smoked. She has never used smokeless tobacco. She reports that she does not drink alcohol or use drugs.  Past Medical History:  Diagnosis Date  . Abnormal Pap smear   . Arthritis    in spine  . GERD (gastroesophageal reflux disease)   . Hypertension   . STD (sexually transmitted disease)    HSV2  . Thyroid cyst    drained it    Past Surgical History:  Procedure Laterality Date  . BIOPSY THYROID      Current Outpatient Medications  Medication Sig Dispense Refill  . hydrochlorothiazide (HYDRODIURIL) 25 MG tablet TK 1 T PO D FOR BP    . methocarbamol (ROBAXIN) 500 MG tablet as needed.   3  . pantoprazole (PROTONIX) 40 MG tablet Take 40 mg by mouth daily.    . Prenatal 27-1 MG TABS TAKE 1 TABLET BY MOUTH  DAILY 90 tablet 3  . valACYclovir  (VALTREX) 1000 MG tablet TAKE 1 TABLET BY MOUTH  DAILY 90 tablet 3  . VITAMIN D PO Take by mouth.     No current facility-administered medications for this visit.     Family History  Problem Relation Age of Onset  . Hypertension Mother   . Diabetes Mother   . Hypertension Maternal Grandmother   . Breast cancer Neg Hx     ROS:  Pertinent items are noted in HPI.  Otherwise, a comprehensive ROS was negative.  Exam:   BP 120/80   Pulse 70   Temp (!) 97.1 F (36.2 C) (Skin)   Resp 16   Ht 5' 5.25" (1.657 m)   Wt 181 lb (82.1 kg)   LMP 11/17/2018 (Exact Date)   BMI 29.89 kg/m  Height: 5' 5.25" (165.7 cm) Ht Readings from Last 3 Encounters:  12/18/18 5' 5.25" (1.657 m)  12/11/17 5' 5.25" (1.657 m)  10/27/16 5' 5.25" (1.657 m)    General appearance: alert, cooperative and appears stated age Head: Normocephalic, without obvious abnormality, atraumatic Neck: no adenopathy, supple, symmetrical, trachea midline and thyroid normal to inspection and palpation Lungs: clear to auscultation bilaterally Breasts: normal appearance, no masses or tenderness, No nipple retraction or dimpling, No nipple discharge or bleeding, No axillary or supraclavicular adenopathy Heart: regular rate and rhythm Abdomen: soft, non-tender; no masses,  no organomegaly  Extremities: extremities normal, atraumatic, no cyanosis or edema Skin: Skin color, texture, turgor normal. No rashes or lesions Lymph nodes: Cervical, supraclavicular, and axillary nodes normal. No abnormal inguinal nodes palpated Neurologic: Grossly normal   Pelvic: External genitalia:  no lesions              Urethra:  normal appearing urethra with no masses, tenderness or lesions              Bartholin's and Skene's: normal                 Vagina: normal appearing vagina with normal color and discharge, no lesions              Cervix: no cervical motion tenderness, no lesions and normal appearance              Pap taken: Yes.    Bimanual Exam:  Uterus:  normal size, contour, position, consistency, mobility, non-tender and anteverted              Adnexa: normal adnexa and no mass, fullness, tenderness               Rectovaginal: Confirms               Anus:  normal sphincter tone, no lesions  Chaperone present: yes  A:  Well Woman with normal exam  Contraception none, aware of pregnancy risk, may use condoms  History of HSV continues with suppression dose.  Hypertension, GERD with PCP management  P:   Reviewed health and wellness pertinent to exam  Rx Valtrex see order with instructions  Continue follow up with PCP until she establishes with another PCP. List given  Pap smear: yes  counseled on breast self exam, mammography screening, feminine hygiene, menopause, adequate intake of calcium and vitamin D, diet and exercise  return annually or prn  An After Visit Summary was printed and given to the patient.

## 2018-12-20 LAB — CYTOLOGY - PAP
Comment: NEGATIVE
Diagnosis: NEGATIVE
High risk HPV: NEGATIVE

## 2019-02-17 ENCOUNTER — Other Ambulatory Visit: Payer: Self-pay | Admitting: Certified Nurse Midwife

## 2019-02-17 DIAGNOSIS — Z1231 Encounter for screening mammogram for malignant neoplasm of breast: Secondary | ICD-10-CM

## 2019-03-12 ENCOUNTER — Other Ambulatory Visit: Payer: Self-pay

## 2019-03-12 ENCOUNTER — Ambulatory Visit
Admission: RE | Admit: 2019-03-12 | Discharge: 2019-03-12 | Disposition: A | Discharge status: Home / Self Care | Payer: 59 | Source: Ambulatory Visit | Attending: Certified Nurse Midwife | Admitting: Certified Nurse Midwife

## 2019-03-12 DIAGNOSIS — Z1231 Encounter for screening mammogram for malignant neoplasm of breast: Secondary | ICD-10-CM

## 2019-03-12 NOTE — Progress Notes (Signed)
Mammogram negative Density C Birads 1 repeat yearly

## 2019-04-04 ENCOUNTER — Ambulatory Visit: Payer: 59 | Attending: Internal Medicine

## 2019-04-04 DIAGNOSIS — Z23 Encounter for immunization: Secondary | ICD-10-CM

## 2019-04-04 NOTE — Progress Notes (Signed)
   Covid-19 Vaccination Clinic  Name:  Jennifer Nolan    MRN: [id number] DOB: 1970/04/15  04/04/2019  Jennifer Nolan was observed post Covid-19 immunization for 15 minutes without incident. She was provided with Vaccine Information Sheet and instruction to access the V-Safe system.   Jennifer Nolan was instructed to call 911 with any severe reactions post vaccine: Marland Kitchen Difficulty breathing  . Swelling of face and throat  . A fast heartbeat  . A bad rash all over body  . Dizziness and weakness   Immunizations Administered    Name Date Dose VIS Date Route   Pfizer COVID-19 Vaccine 04/04/2019  4:12 PM 0.3 mL 12/27/2018 Intramuscular   Manufacturer: [employer], [employer]   Lot: [id number]   NDC: [address]-5

## 2019-04-07 ENCOUNTER — Encounter: Payer: Self-pay | Admitting: Certified Nurse Midwife

## 2019-04-30 ENCOUNTER — Ambulatory Visit: Payer: 59 | Attending: Internal Medicine

## 2019-04-30 DIAGNOSIS — Z23 Encounter for immunization: Secondary | ICD-10-CM

## 2019-04-30 NOTE — Progress Notes (Signed)
   Covid-19 Vaccination Clinic  Name:  Jennifer Nolan    MRN: [id number] DOB: 06/23/1970  04/30/2019  Jennifer Nolan was observed post Covid-19 immunization for 15 minutes without incident. She was provided with Vaccine Information Sheet and instruction to access the V-Safe system.   Jennifer Nolan was instructed to call 911 with any severe reactions post vaccine: Marland Kitchen Difficulty breathing  . Swelling of face and throat  . A fast heartbeat  . A bad rash all over body  . Dizziness and weakness   Immunizations Administered    Name Date Dose VIS Date Route   Pfizer COVID-19 Vaccine 04/30/2019  4:35 PM 0.3 mL 12/27/2018 Intramuscular   Manufacturer: [employer], [employer]   Lot: [id number]   NDC: [address]-6

## 2019-12-19 ENCOUNTER — Ambulatory Visit: Payer: 59 | Admitting: Certified Nurse Midwife

## 2020-01-17 HISTORY — PX: TOOTH EXTRACTION: SUR596

## 2020-01-24 ENCOUNTER — Ambulatory Visit: Payer: 59 | Attending: Internal Medicine

## 2020-01-24 DIAGNOSIS — Z23 Encounter for immunization: Secondary | ICD-10-CM

## 2020-01-24 NOTE — Progress Notes (Signed)
   Covid-19 Vaccination Clinic  Name:  BESAN KETCHEM    MRN: [id number] DOB: 03/08/1970  01/24/2020  Ms. Vanwey was observed post Covid-19 immunization for 15 minutes without incident. She was provided with Vaccine Information Sheet and instruction to access the V-Safe system.   Ms. Pester was instructed to call 911 with any severe reactions post vaccine: Marland Kitchen Difficulty breathing  . Swelling of face and throat  . A fast heartbeat  . A bad rash all over body  . Dizziness and weakness   Immunizations Administered    Name Date Dose VIS Date Route   Pfizer COVID-19 Vaccine 01/24/2020 12:14 PM 0.3 mL 11/05/2019 Intramuscular   Manufacturer: [employer], [employer]   Lot: [id number]   NDC: [address]-8

## 2020-02-03 NOTE — Progress Notes (Signed)
50 y.o. G26P1001 Married [place] American female here for annual exam.   Period Pattern: (!) Irregular Menstrual Control: Maxi pad Dysmenorrhea: (!) Mild Dysmenorrhea Symptoms: Cramping   Periods started to get irregular starting in November 2021, (lasted 2 weeks) skipped December. Had period in January (started 2 days ago), seems a little heavier at times.  Patient's last menstrual period was 02/03/2020 (exact date).          Sexually active: Yes.    The current method of family planning is none.    Exercising: Yes.    cycling, yoga, strength training Smoker:  no  Health Maintenance: Pap:  12-18-2018 neg HPV HR neg History of abnormal Pap:  yes MMG:  03-12-2019 category c density birads 1:neg Colonoscopy:  none BMD:   none TDaP:  2014 Gardasil:   Not done Covid-19: pfizer Hep C testing: not done Screening Labs: followed by PCP   reports that she has never smoked. She has never used smokeless tobacco. She reports that she does not drink alcohol and does not use drugs.  Past Medical History:  Diagnosis Date  . Abnormal Pap smear   . Arthritis    in spine  . GERD (gastroesophageal reflux disease)   . Hypertension   . STD (sexually transmitted disease)    HSV2  . Thyroid cyst    drained it    Past Surgical History:  Procedure Laterality Date  . BIOPSY THYROID      Current Outpatient Medications  Medication Sig Dispense Refill  . hydrochlorothiazide (HYDRODIURIL) 25 MG tablet TK 1 T PO D FOR BP    . Omega-3 Fatty Acids (OMEGA 3 PO) Take by mouth.    . pantoprazole (PROTONIX) 40 MG tablet Take 40 mg by mouth as needed.    . Prenatal 27-1 MG TABS TAKE 1 TABLET BY MOUTH  DAILY 90 tablet 3  . valACYclovir (VALTREX) 1000 MG tablet Take 1 tablet (1,000 mg total) by mouth daily. (Patient taking differently: Take 1,000 mg by mouth as needed.) 90 tablet 3  . VITAMIN D PO Take by mouth.     No current facility-administered medications for this visit.    Family  History  Problem Relation Age of Onset  . Hypertension Mother   . Diabetes Mother   . Hypertension Maternal Grandmother   . Breast cancer Neg Hx     Review of Systems  Constitutional: Negative.   HENT: Negative.   Eyes: Negative.   Respiratory: Negative.   Cardiovascular: Negative.   Gastrointestinal: Negative.   Endocrine: Negative.   Genitourinary:       Irregular cycle  Musculoskeletal: Negative.   Skin: Negative.   Allergic/Immunologic: Negative.   Neurological: Negative.   Hematological: Negative.   Psychiatric/Behavioral: Negative.     Exam:   BP 118/70   Pulse 70   Resp 16   Ht 5' 6.25" (1.683 m)   Wt 182 lb (82.6 kg)   LMP 02/03/2020 (Exact Date)   BMI 29.15 kg/m   Height: 5' 6.25" (168.3 cm)  General appearance: alert, cooperative and appears stated age, no acute distress Head: Normocephalic, without obvious abnormality Neck: no adenopathy, thyroid palpable, enlarged (has been evaluated) Lungs: clear to auscultation bilaterally Breasts: normal appearance, no masses or tenderness Heart: regular rate and rhythm Abdomen: soft, non-tender; no masses,  no organomegaly Extremities: extremities normal, no edema Skin: No rashes or lesions Lymph nodes: Cervical, supraclavicular, and axillary nodes normal. No abnormal inguinal nodes palpated Neurologic: Grossly normal  Pelvic: External genitalia:  no lesions              Urethra:  normal appearing urethra with no masses, tenderness or lesions              Bartholins and Skenes: normal                 Vagina: normal appearing vagina, appropriate for age, moderate amount blood noted in vaginal vault, no clots              Cervix: neg cervical motion tenderness, no visible lesions             Bimanual Exam:   Uterus:  normal size, contour, position, consistency, mobility, non-tender              Adnexa: no mass, fullness, tenderness                 Joy, CMA Chaperone was present for exam.  A:  Well Woman  with normal exam   Well woman exam with routine gynecological exam  OCP (oral contraceptive pills) initiation - Plan: norethindrone (MICRONOR) 0.35 MG tablet  Irregular bleeding  HSV-2 infection - Plan: valACYclovir (VALTREX) 500 MG tablet    P:   Pap :due 2025  Mammogram: to be scheduled by pt, due 02/2020  Labs: done with PCP  Medications: Valtrex, norethindrone 0.35   If continued prolonged or abnormal bleeding, pt to call,  Will schedule [place] for evaluation of bleeding

## 2020-02-05 ENCOUNTER — Encounter: Payer: Self-pay | Admitting: Nurse Practitioner

## 2020-02-05 ENCOUNTER — Other Ambulatory Visit: Payer: Self-pay

## 2020-02-05 ENCOUNTER — Ambulatory Visit (INDEPENDENT_AMBULATORY_CARE_PROVIDER_SITE_OTHER): Payer: 59 | Admitting: Nurse Practitioner

## 2020-02-05 VITALS — BP 118/70 | HR 70 | Resp 16 | Ht 66.25 in | Wt 182.0 lb

## 2020-02-05 DIAGNOSIS — Z01419 Encounter for gynecological examination (general) (routine) without abnormal findings: Secondary | ICD-10-CM | POA: Diagnosis not present

## 2020-02-05 DIAGNOSIS — B009 Herpesviral infection, unspecified: Secondary | ICD-10-CM

## 2020-02-05 DIAGNOSIS — N926 Irregular menstruation, unspecified: Secondary | ICD-10-CM | POA: Diagnosis not present

## 2020-02-05 DIAGNOSIS — Z30011 Encounter for initial prescription of contraceptive pills: Secondary | ICD-10-CM | POA: Diagnosis not present

## 2020-02-05 MED ORDER — VALACYCLOVIR HCL 500 MG PO TABS: 500.0000 mg | ORAL_TABLET | Freq: Two times a day (BID) | ORAL | 5 refills | Status: DC

## 2020-02-05 MED ORDER — NORETHINDRONE 0.35 MG PO TABS: 1.0000 | ORAL_TABLET | Freq: Every day | ORAL | 4 refills | Status: DC

## 2020-02-05 NOTE — Patient Instructions (Signed)
Health Maintenance, Female Adopting a healthy lifestyle and getting preventive care are important in promoting health and wellness. Ask your health care provider about:  The right schedule for you to have regular tests and exams.  Things you can do on your own to prevent diseases and keep yourself healthy. What should I know about diet, weight, and exercise? Eat a healthy diet  Eat a diet that includes plenty of vegetables, fruits, low-fat dairy products, and lean protein.  Do not eat a lot of foods that are high in solid fats, added sugars, or sodium.   Maintain a healthy weight Body mass index (BMI) is used to identify weight problems. It estimates body fat based on height and weight. Your health care provider can help determine your BMI and help you achieve or maintain a healthy weight. Get regular exercise Get regular exercise. This is one of the most important things you can do for your health. Most adults should:  Exercise for at least 150 minutes each week. The exercise should increase your heart rate and make you sweat (moderate-intensity exercise).  Do strengthening exercises at least twice a week. This is in addition to the moderate-intensity exercise.  Spend less time sitting. Even light physical activity can be beneficial. Watch cholesterol and blood lipids Have your blood tested for lipids and cholesterol at 50 years of age, then have this test every 5 years. Have your cholesterol levels checked more often if:  Your lipid or cholesterol levels are high.  You are older than 50 years of age.  You are at high risk for heart disease. What should I know about cancer screening? Depending on your health history and family history, you may need to have cancer screening at various ages. This may include screening for:  Breast cancer.  Cervical cancer.  Colorectal cancer.  Skin cancer.  Lung cancer. What should I know about heart disease, diabetes, and high blood  pressure? Blood pressure and heart disease  High blood pressure causes heart disease and increases the risk of stroke. This is more likely to develop in people who have high blood pressure readings, are of African descent, or are overweight.  Have your blood pressure checked: ? Every 3-5 years if you are 18-39 years of age. ? Every year if you are 40 years old or older. Diabetes Have regular diabetes screenings. This checks your fasting blood sugar level. Have the screening done:  Once every three years after age 40 if you are at a normal weight and have a low risk for diabetes.  More often and at a younger age if you are overweight or have a high risk for diabetes. What should I know about preventing infection? Hepatitis B If you have a higher risk for hepatitis B, you should be screened for this virus. Talk with your health care provider to find out if you are at risk for hepatitis B infection. Hepatitis C Testing is recommended for:  Everyone born from 1945 through 1965.  Anyone with known risk factors for hepatitis C. Sexually transmitted infections (STIs)  Get screened for STIs, including gonorrhea and chlamydia, if: ? You are sexually active and are younger than 50 years of age. ? You are older than 50 years of age and your health care provider tells you that you are at risk for this type of infection. ? Your sexual activity has changed since you were last screened, and you are at increased risk for chlamydia or gonorrhea. Ask your health care provider   if you are at risk.  Ask your health care provider about whether you are at high risk for HIV. Your health care provider may recommend a prescription medicine to help prevent HIV infection. If you choose to take medicine to prevent HIV, you should first get tested for HIV. You should then be tested every 3 months for as long as you are taking the medicine. Pregnancy  If you are about to stop having your period (premenopausal) and  you may become pregnant, seek counseling before you get pregnant.  Take 400 to 800 micrograms (mcg) of folic acid every day if you become pregnant.  Ask for birth control (contraception) if you want to prevent pregnancy. Osteoporosis and menopause Osteoporosis is a disease in which the bones lose minerals and strength with aging. This can result in bone fractures. If you are 65 years old or older, or if you are at risk for osteoporosis and fractures, ask your health care provider if you should:  Be screened for bone loss.  Take a calcium or vitamin D supplement to lower your risk of fractures.  Be given hormone replacement therapy (HRT) to treat symptoms of menopause. Follow these instructions at home: Lifestyle  Do not use any products that contain nicotine or tobacco, such as cigarettes, e-cigarettes, and chewing tobacco. If you need help quitting, ask your health care provider.  Do not use street drugs.  Do not share needles.  Ask your health care provider for help if you need support or information about quitting drugs. Alcohol use  Do not drink alcohol if: ? Your health care provider tells you not to drink. ? You are pregnant, may be pregnant, or are planning to become pregnant.  If you drink alcohol: ? Limit how much you use to 0-1 drink a day. ? Limit intake if you are breastfeeding.  Be aware of how much alcohol is in your drink. In the [place] one drink equals one 12 oz bottle of beer (355 mL), one 5 oz glass of wine (148 mL), or one 1 oz glass of hard liquor (44 mL). General instructions  Schedule regular health, dental, and eye exams.  Stay current with your vaccines.  Tell your health care provider if: ? You often feel depressed. ? You have ever been abused or do not feel safe at home. Summary  Adopting a healthy lifestyle and getting preventive care are important in promoting health and wellness.  Follow your health care provider's instructions about healthy  diet, exercising, and getting tested or screened for diseases.  Follow your health care provider's instructions on monitoring your cholesterol and blood pressure. This information is not intended to replace advice given to you by your health care provider. Make sure you discuss any questions you have with your health care provider. Document Revised: 12/26/2017 Document Reviewed: 12/26/2017 Elsevier Patient Education  2021 Elsevier Inc.  

## 2020-03-24 ENCOUNTER — Other Ambulatory Visit: Payer: Self-pay | Admitting: Nurse Practitioner

## 2020-03-24 DIAGNOSIS — Z0000 Encounter for general adult medical examination without abnormal findings: Secondary | ICD-10-CM

## 2020-03-29 ENCOUNTER — Inpatient Hospital Stay: Admission: RE | Admit: 2020-03-29 | Payer: 59 | Source: Ambulatory Visit

## 2020-05-28 ENCOUNTER — Other Ambulatory Visit: Payer: Self-pay

## 2020-05-28 ENCOUNTER — Ambulatory Visit
Admission: RE | Admit: 2020-05-28 | Discharge: 2020-05-28 | Disposition: A | Discharge status: Home / Self Care | Payer: 59 | Source: Ambulatory Visit | Attending: Nurse Practitioner | Admitting: Nurse Practitioner

## 2020-05-28 DIAGNOSIS — Z0000 Encounter for general adult medical examination without abnormal findings: Secondary | ICD-10-CM

## 2020-05-31 ENCOUNTER — Other Ambulatory Visit: Payer: Self-pay | Admitting: Nurse Practitioner

## 2020-05-31 DIAGNOSIS — R928 Other abnormal and inconclusive findings on diagnostic imaging of breast: Secondary | ICD-10-CM

## 2020-06-04 NOTE — Progress Notes (Signed)
Follow up scheduled June 18, 2020

## 2020-06-18 ENCOUNTER — Other Ambulatory Visit: Payer: Self-pay

## 2020-06-18 ENCOUNTER — Ambulatory Visit: Payer: 59

## 2020-06-18 ENCOUNTER — Ambulatory Visit
Admission: RE | Admit: 2020-06-18 | Discharge: 2020-06-18 | Disposition: A | Discharge status: Home / Self Care | Payer: 59 | Source: Ambulatory Visit | Attending: Nurse Practitioner | Admitting: Nurse Practitioner

## 2020-06-18 DIAGNOSIS — R928 Other abnormal and inconclusive findings on diagnostic imaging of breast: Secondary | ICD-10-CM

## 2020-11-22 ENCOUNTER — Encounter: Payer: Self-pay | Admitting: Gastroenterology

## 2021-01-05 ENCOUNTER — Other Ambulatory Visit: Payer: Self-pay

## 2021-01-05 ENCOUNTER — Ambulatory Visit (AMBULATORY_SURGERY_CENTER): Payer: 59 | Admitting: *Deleted

## 2021-01-05 VITALS — Ht 65.0 in | Wt 180.0 lb

## 2021-01-05 DIAGNOSIS — Z1211 Encounter for screening for malignant neoplasm of colon: Secondary | ICD-10-CM

## 2021-01-05 MED ORDER — NA SULFATE-K SULFATE-MG SULF 17.5-3.13-1.6 GM/177ML PO SOLN: 1.0000 | Freq: Once | ORAL | 0 refills | Status: AC

## 2021-01-05 NOTE — Progress Notes (Signed)
°  Pre visit completed over telephone.  Instructions forwarded through MyChart and secure e-mail [email or web address]  No egg or soy allergy known to patient  No issues known to pt with past sedation with any surgeries or procedures Patient denies ever being told they had issues or difficulty with intubation  No FH of Malignant Hyperthermia Pt is not on diet pills Pt is not on  home 02  Pt is not on blood thinners  Pt denies issues with constipation  No A fib or A flutter   Due to the COVID-19 pandemic we are asking patients to follow certain guidelines in [place] and the [facility]   Pt aware of COVID protocols and [facility] guidelines

## 2021-01-18 ENCOUNTER — Encounter: Payer: Self-pay | Admitting: Gastroenterology

## 2021-01-20 ENCOUNTER — Encounter: Payer: Self-pay | Admitting: Certified Registered Nurse Anesthetist

## 2021-01-21 ENCOUNTER — Ambulatory Visit (AMBULATORY_SURGERY_CENTER): Payer: 59 | Admitting: Gastroenterology

## 2021-01-21 ENCOUNTER — Other Ambulatory Visit: Payer: Self-pay

## 2021-01-21 ENCOUNTER — Encounter: Payer: Self-pay | Admitting: Gastroenterology

## 2021-01-21 VITALS — BP 119/71 | HR 62 | Temp 98.4°F | Resp 15 | Ht 65.0 in | Wt 180.0 lb

## 2021-01-21 DIAGNOSIS — Z1211 Encounter for screening for malignant neoplasm of colon: Secondary | ICD-10-CM | POA: Diagnosis present

## 2021-01-21 HISTORY — PX: COLONOSCOPY WITH PROPOFOL: SHX5780

## 2021-01-21 MED ORDER — SODIUM CHLORIDE 0.9 % IV SOLN: 500.0000 mL | Freq: Once | INTRAVENOUS | Status: DC

## 2021-01-21 NOTE — Patient Instructions (Signed)
Please read handouts provided. ?Continue present medications. ?Repeat colonoscopy in 10 years for screening. ? ? ?YOU HAD AN ENDOSCOPIC PROCEDURE TODAY AT THE [facility]:   Refer to the procedure report that was given to you for any specific questions about what was found during the examination.  If the procedure report does not answer your questions, please call your gastroenterologist to clarify.  If you requested that your care partner not be given the details of your procedure findings, then the procedure report has been included in a sealed envelope for you to review at your convenience later. ? ?YOU SHOULD EXPECT: Some feelings of bloating in the abdomen. Passage of more gas than usual.  Walking can help get rid of the air that was put into your GI tract during the procedure and reduce the bloating. If you had a lower endoscopy (such as a colonoscopy or flexible sigmoidoscopy) you may notice spotting of blood in your stool or on the toilet paper. If you underwent a bowel prep for your procedure, you may not have a normal bowel movement for a few days. ? ?Please Note:  You might notice some irritation and congestion in your nose or some drainage.  This is from the oxygen used during your procedure.  There is no need for concern and it should clear up in a day or so. ? ?SYMPTOMS TO REPORT IMMEDIATELY: ? ?Following lower endoscopy (colonoscopy or flexible sigmoidoscopy): ? Excessive amounts of blood in the stool ? Significant tenderness or worsening of abdominal pains ? Swelling of the abdomen that is new, acute ? Fever of 100?F or higher ? ? ?For urgent or emergent issues, a gastroenterologist can be reached at any hour by calling [phone]. ?Do not use MyChart messaging for urgent concerns.  ? ? ?DIET:  We do recommend a small meal at first, but then you may proceed to your regular diet.  Drink plenty of fluids but you should avoid alcoholic beverages for 24 hours. ? ?ACTIVITY:  You should  plan to take it easy for the rest of today and you should NOT DRIVE or use heavy machinery until tomorrow (because of the sedation medicines used during the test).   ? ?FOLLOW UP: ?Our staff will call the number listed on your records 48-72 hours following your procedure to check on you and address any questions or concerns that you may have regarding the information given to you following your procedure. If we do not reach you, we will leave a message.  We will attempt to reach you two times.  During this call, we will ask if you have developed any symptoms of COVID 19. If you develop any symptoms (ie: fever, flu-like symptoms, shortness of breath, cough etc.) before then, please call [phone].  If you test positive for Covid 19 in the 2 weeks post procedure, please call and report this information to us.   ? ?If any biopsies were taken you will be contacted by phone or by letter within the next 1-3 weeks.  Please call us at [phone] if you have not heard about the biopsies in 3 weeks.  ? ? ?SIGNATURES/CONFIDENTIALITY: ?You and/or your care partner have signed paperwork which will be entered into your electronic medical record.  These signatures attest to the fact that that the information above on your After Visit Summary has been reviewed and is understood.  Full responsibility of the confidentiality of this discharge information lies with you and/or your care-partner.  ?

## 2021-01-21 NOTE — Progress Notes (Signed)
[facility] History and Physical   Primary Care Physician:  Jennifer Penna, MD   Reason for Procedure:   Colon cancer screening  Plan:    Screening colonoscopy     HPI: Jennifer Nolan is a 51 y.o. female undergoing initial average risk screening colonoscopy.  She has no family history of colon cancer and no chronic GI symptoms.    Past Medical History:  Diagnosis Date   Abnormal Pap smear    Arthritis    in spine   GERD (gastroesophageal reflux disease)    Hypertension    STD (sexually transmitted disease)    HSV2   Thyroid cyst    drained it    Past Surgical History:  Procedure Laterality Date   BIOPSY THYROID      Prior to Admission medications   Medication Sig Start Date End Date Taking? Authorizing Provider  hydrochlorothiazide (HYDRODIURIL) 25 MG tablet TK 1 T PO D FOR BP 11/01/16  Yes [provider]  Omega-3 Fatty Acids (OMEGA 3 PO) Take by mouth.   Yes [provider]  Prenatal 27-1 MG TABS TAKE 1 TABLET BY MOUTH  DAILY 07/11/18  Yes Jennifer Nolan, CNM  VITAMIN D PO Take by mouth.   Yes [provider]  pantoprazole (PROTONIX) 40 MG tablet Take 40 mg by mouth as needed.    [provider]  valACYclovir (VALTREX) 500 MG tablet Take 1 tablet (500 mg total) by mouth 2 (two) times daily. When outbreak occurs, take 3-5 days 02/05/20   Jennifer Crane, NP    Current Outpatient Medications  Medication Sig Dispense Refill   hydrochlorothiazide (HYDRODIURIL) 25 MG tablet TK 1 T PO D FOR BP     Omega-3 Fatty Acids (OMEGA 3 PO) Take by mouth.     Prenatal 27-1 MG TABS TAKE 1 TABLET BY MOUTH  DAILY 90 tablet 3   VITAMIN D PO Take by mouth.     pantoprazole (PROTONIX) 40 MG tablet Take 40 mg by mouth as needed.     valACYclovir (VALTREX) 500 MG tablet Take 1 tablet (500 mg total) by mouth 2 (two) times daily. When outbreak occurs, take 3-5 days 30 tablet 5   Current Facility-Administered Medications  Medication Dose  Route Frequency Provider Last Rate Last Admin   0.9 %  sodium chloride infusion  500 mL Intravenous Once Jennifer Lucks, MD        Allergies as of 01/21/2021   (No Known Allergies)    Family History  Problem Relation Age of Onset   Hypertension Mother    Diabetes Mother    Hypertension Maternal Grandmother    Breast cancer Neg Hx    Colon polyps Neg Hx    Colon cancer Neg Hx     Social History   Socioeconomic History   Marital status: Married    Spouse name: Not on file   Number of children: Not on file   Years of education: Not on file   Highest education level: Not on file  Occupational History   Not on file  Tobacco Use   Smoking status: Never   Smokeless tobacco: Never  Substance and Sexual Activity   Alcohol use: No    Alcohol/week: 0.0 standard drinks   Drug use: No   Sexual activity: Yes    Partners: Male    Birth control/protection: None  Other Topics Concern   Not on file  Social History Narrative   Not on file   Social  Determinants of Health   Financial Resource Strain: Not on file  Food Insecurity: Not on file  Transportation Needs: Not on file  Physical Activity: Not on file  Stress: Not on file  Social Connections: Not on file  Intimate Partner Violence: Not on file    Review of Systems:  All other review of systems negative except as mentioned in the HPI.  Physical Exam: Vital signs BP (!) 141/83 (BP Location: Right Arm, Patient Position: Sitting, Cuff Size: Normal)    Pulse 72    Temp 98.4 F (36.9 C) (Temporal)    Ht 5\' 5"  (1.651 m)    Wt 180 lb (81.6 kg)    LMP 11/30/2020 Comment: denies pregnancy   SpO2 100%    BMI 29.95 kg/m   General:   Alert,  Well-developed, well-nourished, pleasant and cooperative in NAD Airway:  Mallampati 1 Lungs:  Clear throughout to auscultation.   Heart:  Regular rate and rhythm; no murmurs, clicks, rubs,  or gallops. Abdomen:  Soft, nontender and nondistended. Normal bowel sounds.   Neuro/Psych:   Normal mood and affect. A and O x 3   Jennifer Nolan E. 12/02/2020, MD [facility]

## 2021-01-21 NOTE — Progress Notes (Signed)
Vitals-SM  Pt's states no medical or surgical changes since previsit or office visit. 

## 2021-01-21 NOTE — Op Note (Signed)
[facility] Patient Name: Jennifer Nolan Procedure Date: 01/21/2021 7:29 AM MRN: [id number] Endoscopist: Lorin Picket E. Tomasa Rand , MD Age: 51 Referring MD:  Date of Birth: 06/25/1970 Gender: Female Account #: [id number] Procedure:                Colonoscopy Indications:              Screening for colorectal malignant neoplasm, This                            is the patient's first colonoscopy Medicines:                Monitored Anesthesia Care Procedure:                Pre-Anesthesia Assessment:                           - Prior to the procedure, a History and Physical                            was performed, and patient medications and                            allergies were reviewed. The patient's tolerance of                            previous anesthesia was also reviewed. The risks                            and benefits of the procedure and the sedation                            options and risks were discussed with the patient.                            All questions were answered, and informed consent                            was obtained. Prior Anticoagulants: The patient has                            taken no previous anticoagulant or antiplatelet                            agents. ASA Grade Assessment: II - A patient with                            mild systemic disease. After reviewing the risks                            and benefits, the patient was deemed in                            satisfactory condition to undergo the procedure.  After obtaining informed consent, the colonoscope                            was passed under direct vision. Throughout the                            procedure, the patient's blood pressure, pulse, and                            oxygen saturations were monitored continuously. The                            CF [id number] #[id number] was introduced through the anus                            and advanced to the  the terminal ileum, with                            identification of the appendiceal orifice and IC                            valve. The colonoscopy was performed without                            difficulty. The patient tolerated the procedure                            well. The quality of the bowel preparation was                            excellent. The terminal ileum, ileocecal valve,                            appendiceal orifice, and rectum were photographed.                            The bowel preparation used was SUPREP via split                            dose instruction. Scope In: 8:01:51 AM Scope Out: 8:10:09 AM Scope Withdrawal Time: 0 hours 5 minutes 51 seconds  Total Procedure Duration: 0 hours 8 minutes 18 seconds  Findings:                 The perianal and digital rectal examinations were                            normal. Pertinent negatives include normal                            sphincter tone and no palpable rectal lesions.                           The colon (entire examined portion) appeared normal.  The terminal ileum appeared normal.                           The retroflexed view of the distal rectum and anal                            verge was normal and showed no anal or rectal                            abnormalities. Complications:            No immediate complications. Estimated Blood Loss:     Estimated blood loss: none. Impression:               - The entire examined colon is normal.                           - The examined portion of the ileum was normal.                           - The distal rectum and anal verge are normal on                            retroflexion view.                           - No specimens collected. Recommendation:           - Patient has a contact number available for                            emergencies. The signs and symptoms of potential                            delayed complications  were discussed with the                            patient. Return to normal activities tomorrow.                            Written discharge instructions were provided to the                            patient.                           - Resume previous diet.                           - Continue present medications.                           - Repeat colonoscopy in 10 years for screening                            purposes. Quavis Klutz E. Tomasa Rand, MD 01/21/2021 8:17:34 AM This report has been signed electronically.

## 2021-01-21 NOTE — Progress Notes (Signed)
Report given to PACU, vss 

## 2021-01-25 ENCOUNTER — Telehealth: Payer: Self-pay

## 2021-01-25 NOTE — Telephone Encounter (Signed)
°  Follow up Call-  Call back number 01/21/2021  Post procedure Call Back phone  # [phone]  Permission to leave phone message Yes  Some recent data might be hidden     Patient questions:  Do you have a fever, pain , or abdominal swelling? No. Pain Score  0 *  Have you tolerated food without any problems? Yes.    Have you been able to return to your normal activities? Yes.    Do you have any questions about your discharge instructions: Diet   Yes.   Medications  Yes.   Follow up visit  Yes.    Do you have questions or concerns about your Care? No.  Actions: * If pain score is 4 or above: No action needed, pain <4.  Have you developed a fever since your procedure? no  2.   Have you had an respiratory symptoms (SOB or cough) since your procedure? no  3.   Have you tested positive for COVID 19 since your procedure no  4.   Have you had any family members/close contacts diagnosed with the COVID 19 since your procedure?  no   If yes to any of these questions please route to Laverna Peace, RN and Karlton Lemon, RN

## 2021-02-07 ENCOUNTER — Encounter: Payer: Self-pay | Admitting: Nurse Practitioner

## 2021-02-07 ENCOUNTER — Ambulatory Visit: Payer: 59 | Admitting: Nurse Practitioner

## 2021-02-07 ENCOUNTER — Other Ambulatory Visit: Payer: Self-pay

## 2021-02-07 ENCOUNTER — Ambulatory Visit (INDEPENDENT_AMBULATORY_CARE_PROVIDER_SITE_OTHER): Payer: 59 | Admitting: Nurse Practitioner

## 2021-02-07 VITALS — BP 124/76 | Ht 65.0 in | Wt 185.0 lb

## 2021-02-07 DIAGNOSIS — Z23 Encounter for immunization: Secondary | ICD-10-CM

## 2021-02-07 DIAGNOSIS — B009 Herpesviral infection, unspecified: Secondary | ICD-10-CM | POA: Diagnosis not present

## 2021-02-07 DIAGNOSIS — N946 Dysmenorrhea, unspecified: Secondary | ICD-10-CM

## 2021-02-07 DIAGNOSIS — N951 Menopausal and female climacteric states: Secondary | ICD-10-CM

## 2021-02-07 DIAGNOSIS — Z01419 Encounter for gynecological examination (general) (routine) without abnormal findings: Secondary | ICD-10-CM | POA: Diagnosis not present

## 2021-02-07 DIAGNOSIS — N921 Excessive and frequent menstruation with irregular cycle: Secondary | ICD-10-CM

## 2021-02-07 MED ORDER — VALACYCLOVIR HCL 500 MG PO TABS: 500.0000 mg | ORAL_TABLET | Freq: Two times a day (BID) | ORAL | 5 refills | Status: DC

## 2021-02-07 MED ORDER — IBUPROFEN 800 MG PO TABS: 800.0000 mg | ORAL_TABLET | Freq: Three times a day (TID) | ORAL | 3 refills | Status: DC | PRN

## 2021-02-07 NOTE — Progress Notes (Signed)
Treniya N Garn 1970/07/05 [id number]   History:  51 y.o. G1P1001 presents for annual exam. Perimenopausal. Having cycles every 2-3 months, mild menopausal symptoms. Bleeding is heavy the first couple of days then tapers off with total bleeding time of  7 days. Was prescribed POPs last year for bleeding management but she had bilateral lower abdominal pain all the time with use that she assumes was ovarian cysts. Abnormal pap many years ago, no intervention required. HTN, GERD managed by PCP.  Gynecologic History Patient's last menstrual period was 11/16/2020. Period Duration (Days): 6 Period Pattern: (!) Irregular Menstrual Flow: Heavy Dysmenorrhea: None Contraception/Family planning: abstinence Sexually active: Yes  Health Maintenance Last Pap: 12/18/2018. Results were: Normal, 5-year repeat Last mammogram: 05/28/2020. Results were: Possible mass in right breast. Normal follow up U/S Last colonoscopy: 01/21/2021. Results were: Normal, 10-year recall Last Dexa: Not indicated  Past medical history, past surgical history, family history and social history were all reviewed and documented in the EPIC chart. 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 43 yo daughter.   ROS:  A ROS was performed and pertinent positives and negatives are included.  Exam:  Vitals:   02/07/21 0837  BP: 124/76  Weight: 185 lb (83.9 kg)  Height: 5\' 5"  (1.651 m)   Body mass index is 30.79 kg/m.  General appearance:  Normal Thyroid:  Symmetrical, normal in size, without palpable masses or nodularity. Respiratory  Auscultation:  Clear without wheezing or rhonchi Cardiovascular  Auscultation:  Regular rate, without rubs, murmurs or gallops  Edema/varicosities:  Not grossly evident Abdominal  Soft,nontender, without masses, guarding or rebound.  Liver/spleen:  No organomegaly noted  Hernia:  None appreciated  Skin  Inspection:  Grossly normal Breasts: Examined lying and sitting.   Right: Without masses,  retractions, nipple discharge or axillary adenopathy.   Left: Without masses, retractions, nipple discharge or axillary adenopathy. Genitourinary   Inguinal/mons:  Normal without inguinal adenopathy  External genitalia:  Normal appearing vulva with no masses, tenderness, or lesions  BUS/Urethra/Skene's glands:  Normal  Vagina:  Normal appearing with normal color and discharge, no lesions  Cervix:  Normal appearing without discharge or lesions  Uterus:  Normal in size, shape and contour.  Midline and mobile, nontender  Adnexa/parametria:     Rt: Normal in size, without masses or tenderness.   Lt: Normal in size, without masses or tenderness.  Anus and perineum: Normal  Digital rectal exam: Deferred, recent colonoscopy  Patient informed chaperone available to be present for breast and pelvic exam. Patient has requested no chaperone to be present. Patient has been advised what will be completed during breast and pelvic exam.   Assessment/Plan:  51 y.o. G1P1001 for annual exam.   Well female exam with routine gynecological exam - Education provided on SBEs, importance of preventative screenings, current guidelines, high calcium diet, regular exercise, and multivitamin daily.  Labs with PCP.   Perimenopausal - Having cycles every 2-3 months, mild menopausal symptoms. Bleeding is heavy the first couple of days then tapers off with total bleeding time of  7 days. Was prescribed POPs last year for bleeding management but she had bilateral lower abdominal pain that she assumes was ovarian cysts. We discussed what to expect during perimenopause in regards to bleeding pattern and symptoms.  Dysmenorrhea - Plan: ibuprofen (ADVIL) 800 MG tablet every 8 hour as needed for menstrual cramping.   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   Menorrhagia with irregular cycle - Plan:  ibuprofen (ADVIL) 800 MG tablet every 8 hours as needed. We discussed management of  irregular bleeding s/t perimenopause with hormonal contraception but she is not interested at this time.  Screening for cervical cancer - Abnormal pap many years ago, no intervention required. Will repeat at 5-year interval per guidelines.  Screening for breast cancer - Normal mammogram history.  Continue annual screenings.  Normal breast exam today.  Screening for colon cancer - 01/2021 - normal colonoscopy. Will repeat at 10-year interval per GI's recommendation.   Return in 1 year for annual.     Olivia Mackie DNP, 8:50 AM 02/07/2021

## 2021-02-11 ENCOUNTER — Ambulatory Visit: Payer: 59 | Attending: Internal Medicine

## 2021-02-11 ENCOUNTER — Other Ambulatory Visit: Payer: Self-pay

## 2021-02-11 DIAGNOSIS — Z23 Encounter for immunization: Secondary | ICD-10-CM

## 2021-02-14 ENCOUNTER — Other Ambulatory Visit (HOSPITAL_BASED_OUTPATIENT_CLINIC_OR_DEPARTMENT_OTHER): Payer: Self-pay

## 2021-02-14 MED ORDER — PFIZER COVID-19 VAC BIVALENT 30 MCG/0.3ML IM SUSP
INTRAMUSCULAR | 0 refills | Status: DC
  Filled 2021-02-14: qty 0.3, 1d supply, fill #0

## 2021-02-14 NOTE — Progress Notes (Signed)
° °  Covid-19 Vaccination Clinic  Name:  Jennifer Nolan    MRN: [id number] DOB: 07-18-1970  02/14/2021  Jennifer Nolan was observed post Covid-19 immunization for 15 minutes without incident. She was provided with Vaccine Information Sheet and instruction to access the V-Safe system.   Jennifer Nolan was instructed to call 911 with any severe reactions post vaccine: Difficulty breathing  Swelling of face and throat  A fast heartbeat  A bad rash all over body  Dizziness and weakness   Immunizations Administered     Name Date Dose VIS Date Route   Pfizer Covid-19 Vaccine Bivalent Booster 02/11/2021  1:18 PM 0.3 mL 09/15/2020 Intramuscular   Manufacturer: [employer], [employer]   Lot: [id number]   NDC: [phone]

## 2021-03-01 ENCOUNTER — Other Ambulatory Visit: Payer: Self-pay | Admitting: Internal Medicine

## 2021-03-01 DIAGNOSIS — R1011 Right upper quadrant pain: Secondary | ICD-10-CM

## 2021-03-03 ENCOUNTER — Ambulatory Visit
Admission: RE | Admit: 2021-03-03 | Discharge: 2021-03-03 | Disposition: A | Discharge status: Home / Self Care | Payer: 59 | Source: Ambulatory Visit | Attending: Internal Medicine | Admitting: Internal Medicine

## 2021-03-03 DIAGNOSIS — R1011 Right upper quadrant pain: Secondary | ICD-10-CM

## 2021-03-10 ENCOUNTER — Encounter: Payer: Self-pay | Admitting: Gastroenterology

## 2021-03-10 ENCOUNTER — Ambulatory Visit (INDEPENDENT_AMBULATORY_CARE_PROVIDER_SITE_OTHER): Payer: 59 | Admitting: Gastroenterology

## 2021-03-10 VITALS — BP 120/86 | HR 74 | Resp 16 | Ht 66.0 in | Wt 185.2 lb

## 2021-03-10 DIAGNOSIS — R1310 Dysphagia, unspecified: Secondary | ICD-10-CM | POA: Diagnosis not present

## 2021-03-10 DIAGNOSIS — R1013 Epigastric pain: Secondary | ICD-10-CM | POA: Diagnosis not present

## 2021-03-10 DIAGNOSIS — K760 Fatty (change of) liver, not elsewhere classified: Secondary | ICD-10-CM | POA: Diagnosis not present

## 2021-03-10 NOTE — Patient Instructions (Signed)
If you are age 51 or older, your body mass index should be between 23-30. Your Body mass index is 29.89 kg/m. If this is out of the aforementioned range listed, please consider follow up with your Primary Care Provider.  If you are age 54 or younger, your body mass index should be between 19-25. Your Body mass index is 29.89 kg/m. If this is out of the aformentioned range listed, please consider follow up with your Primary Care Provider.   You have been scheduled for an endoscopy. Please follow written instructions given to you at your visit today. If you use inhalers (even only as needed), please bring them with you on the day of your procedure.  The [place] [facility] providers would like to encourage you to use [facility] to communicate with providers for non-urgent requests or questions.  Due to long hold times on the telephone, sending your provider a message by [facility] may be a faster and more efficient way to get a response.  Please allow 48 business hours for a response.  Please remember that this is for non-urgent requests.   It was a pleasure to see you today!  Thank you for trusting me with your gastrointestinal care!    Scott E.Candis Schatz, MD

## 2021-03-10 NOTE — Progress Notes (Signed)
HPI : 51 year old female with history of GERD referred to [place] by Dr. Alysia Penna for further evaluation of epigastric pain/swallowing difficulty.  She states that for the past several months, every time she swallows food she feels like it is getting held up in her upper abdomen (points to area under right side of rib).  The sensation lasts just a second then resolves, but then repeats with the next swallow.  She has this sensation sometimes with liquids as well.  She denies the sensation of food sitting her chest.  No episodes of food coming back up after swallowing.  No heartburn.  No nausea or vomiting.  She has occasional regurgitation, but not typically associated with the epigastric discomfort.  She frequently feels bloated and has excessive belching in the mornings upon awakening. She has another occasional dull pain in the upper abdomen not associated with swallowing.  This pain is not severe and it comes and goes. Her appetite is good, weight is stable.   She sometimes struggles with constipation, but mostly has regular bowel movements.  No problems with diarrhea or blood in the stool. She takes ibuprofen during her menstrual cycle. She has been taking Protonix for 3-4 years for heartburn which works well.  Her primary provider ordered a right upper quadrant ultrasound to rule out stones.  Ultrasound was negative for gallstones, but was notable for mild hepatic steatosis.  She had a colonoscopy last month for colon cancer screening which was normal.  Past Medical History:  Diagnosis Date   Abnormal Pap smear    Arthritis    in spine   GERD (gastroesophageal reflux disease)    Hypertension    STD (sexually transmitted disease)    HSV2   Thyroid cyst    drained it   Colonoscopy:  Jan 21, 2021:  Normal  Past Surgical History:  Procedure Laterality Date   BIOPSY THYROID     Family History  Problem Relation Age of Onset   Hypertension Mother    Diabetes Mother    Hypertension  Maternal Grandmother    Breast cancer Neg Hx    Colon polyps Neg Hx    Colon cancer Neg Hx    Social History   Tobacco Use   Smoking status: Never   Smokeless tobacco: Never  Substance Use Topics   Alcohol use: Yes    Comment: Occas   Drug use: No   Current Outpatient Medications  Medication Sig Dispense Refill   hydrochlorothiazide (HYDRODIURIL) 25 MG tablet TK 1 T PO D FOR BP     ibuprofen (ADVIL) 800 MG tablet Take 1 tablet (800 mg total) by mouth every 8 (eight) hours as needed. 30 tablet 3   Omega-3 Fatty Acids (OMEGA 3 PO) Take by mouth.     pantoprazole (PROTONIX) 40 MG tablet Take 40 mg by mouth as needed.     Prenatal 27-1 MG TABS TAKE 1 TABLET BY MOUTH  DAILY 90 tablet 3   valACYclovir (VALTREX) 500 MG tablet Take 1 tablet (500 mg total) by mouth 2 (two) times daily. When outbreak occurs, take 3-5 days 30 tablet 5   VITAMIN D PO Take by mouth.     COVID-19 mRNA bivalent vaccine, Pfizer, (PFIZER COVID-19 VAC BIVALENT) injection Inject into the muscle. 0.3 mL 0   No current facility-administered medications for this visit.   No Known Allergies   Review of Systems: All systems reviewed and negative except where noted in HPI.    US  Abdomen Limited RUQ (LIVER/GB)  Result Date: 03/03/2021 CLINICAL DATA:  Right upper quadrant pain. EXAM: ULTRASOUND ABDOMEN LIMITED RIGHT UPPER QUADRANT COMPARISON:  None. FINDINGS: Gallbladder: No gallstones or wall thickening visualized. No sonographic Murphy sign noted by sonographer. Common bile duct: Diameter: 3.3 mm. Liver: Mild increased parenchymal echogenicity compatible with a degree of steatosis. No focal mass. Portal vein is patent on color Doppler imaging with normal direction of blood flow towards the liver. Other: None. IMPRESSION: 1.  No acute hepatobiliary findings. 2.  Suggestion of a mild degree of hepatic steatosis. Electronically Signed   By: Elberta Fortis M.D.   On: 03/03/2021 14:31    Physical Exam: BP 120/86 (BP  Location: Right Arm, Patient Position: Sitting, Cuff Size: Normal)    Pulse 74    Resp 16    Ht 5\' 6"  (1.676 m)    Wt 185 lb 3.2 oz (84 kg)    LMP 02/23/2021    SpO2 98%    BMI 29.89 kg/m  Constitutional: Pleasant,well-developed, African-American female in no acute distress. HEENT: Normocephalic and atraumatic. Conjunctivae are normal. No scleral icterus. Neck supple.  Cardiovascular: Normal rate, regular rhythm.  Pulmonary/chest: Effort normal and breath sounds normal. No wheezing, rales or rhonchi. Abdominal: Soft, nondistended, nontender. Bowel sounds active throughout. There are no masses palpable. No hepatomegaly. Extremities: no edema Neurological: Alert and oriented to person place and time. Skin: Skin is warm and dry. No rashes noted. Psychiatric: Normal mood and affect. Behavior is normal.  CBC    Component Value Date/Time   HGB 11.7 (L) 10/21/2015 1332    CMP     Component Value Date/Time   NA 135 10/21/2015 1407   K 4.0 10/21/2015 1407   CL 101 10/21/2015 1407   CO2 24 10/21/2015 1407   GLUCOSE 77 10/21/2015 1407   BUN 9 10/21/2015 1407   CREATININE 0.88 10/21/2015 1407   CALCIUM 8.8 10/21/2015 1407   PROT 7.4 10/21/2015 1407   ALBUMIN 4.0 10/21/2015 1407   AST 17 10/21/2015 1407   ALT 15 10/21/2015 1407   ALKPHOS 79 10/21/2015 1407   BILITOT 0.5 10/21/2015 1407     ASSESSMENT AND PLAN: 51 year old female with several months of atypical epigastric pain with swallowing.  The very reliable association of her discomfort with swallowing is more suggestive of an esophageal problem such as a stenosis.  However the location of her discomfort to her epigastrium is less consistent with an esophageal etiology.  Regardless, I think an upper endoscopy would be useful to evaluate for a stricture, as well as esophagitis, rule out candidiasis and evaluate for peptic ulcer disease/H. pylori. We also discussed nonalcoholic fatty liver disease and the principles of management to  include improving diet and exercise habits and and weight loss.  She rarely drinks alcohol.  I recommended repeating an ultrasound in a few years.  Epigastric pain with swallowing - EGD  Fatty liver - Counseled on improving diet and exercise habits  The details, risks (including bleeding, perforation, infection, missed lesions, medication reactions and possible hospitalization or surgery if complications occur), benefits, and alternatives to EGD with possible biopsy and possible dilation were discussed with the patient and she consents to proceed.   Dezman Granda E. 44, MD [facility]   CC:  Tomasa Rand, MD

## 2021-03-23 ENCOUNTER — Encounter: Payer: Self-pay | Admitting: Gastroenterology

## 2021-03-25 ENCOUNTER — Ambulatory Visit (AMBULATORY_SURGERY_CENTER): Payer: 59 | Admitting: Gastroenterology

## 2021-03-25 ENCOUNTER — Other Ambulatory Visit: Payer: Self-pay

## 2021-03-25 ENCOUNTER — Encounter: Payer: Self-pay | Admitting: Gastroenterology

## 2021-03-25 VITALS — BP 97/73 | HR 69 | Temp 98.7°F | Resp 13 | Ht 66.0 in | Wt 185.0 lb

## 2021-03-25 DIAGNOSIS — R1310 Dysphagia, unspecified: Secondary | ICD-10-CM

## 2021-03-25 DIAGNOSIS — L7212 Trichodermal cyst: Secondary | ICD-10-CM | POA: Diagnosis not present

## 2021-03-25 DIAGNOSIS — R1013 Epigastric pain: Secondary | ICD-10-CM | POA: Diagnosis not present

## 2021-03-25 DIAGNOSIS — K3189 Other diseases of stomach and duodenum: Secondary | ICD-10-CM | POA: Diagnosis not present

## 2021-03-25 HISTORY — PX: ESOPHAGOGASTRODUODENOSCOPY (EGD) WITH PROPOFOL: SHX5813

## 2021-03-25 MED ORDER — SODIUM CHLORIDE 0.9 % IV SOLN: 500.0000 mL | Freq: Once | INTRAVENOUS | Status: DC

## 2021-03-25 NOTE — Progress Notes (Signed)
Vitals-CW  History reviewed. 

## 2021-03-25 NOTE — Patient Instructions (Signed)
Discharge instructions given. ?Biopsies taken. ?Resume previous medications. ?YOU HAD AN ENDOSCOPIC PROCEDURE TODAY AT THE [facility]:   Refer to the procedure report that was given to you for any specific questions about what was found during the examination.  If the procedure report does not answer your questions, please call your gastroenterologist to clarify.  If you requested that your care partner not be given the details of your procedure findings, then the procedure report has been included in a sealed envelope for you to review at your convenience later. ? ?YOU SHOULD EXPECT: Some feelings of bloating in the abdomen. Passage of more gas than usual.  Walking can help get rid of the air that was put into your GI tract during the procedure and reduce the bloating. If you had a lower endoscopy (such as a colonoscopy or flexible sigmoidoscopy) you may notice spotting of blood in your stool or on the toilet paper. If you underwent a bowel prep for your procedure, you may not have a normal bowel movement for a few days. ? ?Please Note:  You might notice some irritation and congestion in your nose or some drainage.  This is from the oxygen used during your procedure.  There is no need for concern and it should clear up in a day or so. ? ?SYMPTOMS TO REPORT IMMEDIATELY: ? ? ?Following upper endoscopy (EGD) ? Vomiting of blood or coffee ground material ? New chest pain or pain under the shoulder blades ? Painful or persistently difficult swallowing ? New shortness of breath ? Fever of 100?F or higher ? Black, tarry-looking stools ? ?For urgent or emergent issues, a gastroenterologist can be reached at any hour by calling [phone]. ?Do not use MyChart messaging for urgent concerns.  ? ? ?DIET:  We do recommend a small meal at first, but then you may proceed to your regular diet.  Drink plenty of fluids but you should avoid alcoholic beverages for 24 hours. ? ?ACTIVITY:  You should plan to take it  easy for the rest of today and you should NOT DRIVE or use heavy machinery until tomorrow (because of the sedation medicines used during the test).   ? ?FOLLOW UP: ?Our staff will call the number listed on your records 48-72 hours following your procedure to check on you and address any questions or concerns that you may have regarding the information given to you following your procedure. If we do not reach you, we will leave a message.  We will attempt to reach you two times.  During this call, we will ask if you have developed any symptoms of COVID 19. If you develop any symptoms (ie: fever, flu-like symptoms, shortness of breath, cough etc.) before then, please call [phone].  If you test positive for Covid 19 in the 2 weeks post procedure, please call and report this information to us.   ? ?If any biopsies were taken you will be contacted by phone or by letter within the next 1-3 weeks.  Please call us at [phone] if you have not heard about the biopsies in 3 weeks.  ? ? ?SIGNATURES/CONFIDENTIALITY: ?You and/or your care partner have signed paperwork which will be entered into your electronic medical record.  These signatures attest to the fact that that the information above on your After Visit Summary has been reviewed and is understood.  Full responsibility of the confidentiality of this discharge information lies with you and/or your care-partner.  ?

## 2021-03-25 NOTE — Progress Notes (Signed)
Called to room to assist during endoscopic procedure.  Patient ID and intended procedure confirmed with present staff. Received instructions for my participation in the procedure from the performing physician.  

## 2021-03-25 NOTE — Op Note (Signed)
[place] ?Patient Name: Jennifer Nolan ?Procedure Date: 03/25/2021 10:10 AM ?MRN: [id number] ?Endoscopist: Keoki Mchargue E. Candis Schatz , MD ?Age: 51 ?Referring MD:  ?Date of Birth: Nov 06, 1970 ?Gender: Female ?Account #: [id number] ?Procedure:                Upper GI endoscopy ?Indications:              Epigastric abdominal pain ?Medicines:                Monitored Anesthesia Care ?Procedure:                Pre-Anesthesia Assessment: ?                          - Prior to the procedure, a History and Physical  ?                          was performed, and patient medications and  ?                          allergies were reviewed. The patient's tolerance of  ?                          previous anesthesia was also reviewed. The risks  ?                          and benefits of the procedure and the sedation  ?                          options and risks were discussed with the patient.  ?                          All questions were answered, and informed consent  ?                          was obtained. Prior Anticoagulants: The patient has  ?                          taken no previous anticoagulant or antiplatelet  ?                          agents. ASA Grade Assessment: II - A patient with  ?                          mild systemic disease. After reviewing the risks  ?                          and benefits, the patient was deemed in  ?                          satisfactory condition to undergo the procedure. ?                          After obtaining informed consent, the endoscope was  ?  passed under direct vision. Throughout the  ?                          procedure, the patient's blood pressure, pulse, and  ?                          oxygen saturations were monitored continuously. The  ?                          Endoscope was introduced through the mouth, and  ?                          advanced to the third part of duodenum. The upper  ?                          GI endoscopy was  accomplished without difficulty.  ?                          The patient tolerated the procedure well. ?Scope In: ?Scope Out: ?Findings:                 The examined portions of the nasopharynx,  ?                          oropharynx and larynx were normal. ?                          The examined esophagus was normal. ?                          The entire examined stomach was normal. Biopsies  ?                          were taken with a cold forceps for Helicobacter  ?                          pylori testing. Estimated blood loss was minimal. ?                          The examined duodenum was normal. ?Complications:            No immediate complications. ?Estimated Blood Loss:     Estimated blood loss was minimal. ?Impression:               - The examined portions of the nasopharynx,  ?                          oropharynx and larynx were normal. ?                          - Normal esophagus. ?                          - Normal stomach. Biopsied. ?                          -  Normal examined duodenum. ?Recommendation:           - Patient has a contact number available for  ?                          emergencies. The signs and symptoms of potential  ?                          delayed complications were discussed with the  ?                          patient. Return to normal activities tomorrow.  ?                          Written discharge instructions were provided to the  ?                          patient. ?                          - Resume previous diet. ?                          - Continue present medications. ?                          - Await pathology results. ?                          - Follow up as needed in GI clinic for ongoing  ?                          symptoms. ?Sophronia Varney E. Candis Schatz, MD ?03/25/2021 10:37:34 AM ?This report has been signed electronically. ?

## 2021-03-25 NOTE — Progress Notes (Signed)
History and Physical Interval Note: ? ?03/25/2021 ?10:12 AM ? ?Jennifer Nolan  has presented today for endoscopic procedure(s), with the diagnosis of  ?Encounter Diagnoses  ?Name Primary?  ? Abdominal pain, epigastric Yes  ? Dysphagia, unspecified type   ?Marland Kitchen  The various methods of evaluation and treatment have been discussed with the patient and/or family. After consideration of risks, benefits and other options for treatment, the patient has consented to  the endoscopic procedure(s). ? ? The patient's history has been reviewed, patient examined, no change in status, stable for endoscopic procedure(s).  I have reviewed the patient's chart and labs.  Questions were answered to the patient's satisfaction.   ? ? ?[name], MD ?[facility] ? ? ?

## 2021-03-25 NOTE — Progress Notes (Signed)
Sedate, gd SR, tolerated procedure well, VSS, report to RN 

## 2021-03-29 ENCOUNTER — Telehealth: Payer: Self-pay | Admitting: *Deleted

## 2021-03-29 NOTE — Telephone Encounter (Signed)
?  Follow up Call- ? ?Call back number 03/25/2021 01/21/2021  ?Post procedure Call Back phone  # [phone]  ?Permission to leave phone message Yes Yes  ?Some recent data might be hidden  ?  ? ?Patient questions: ? ?Do you have a fever, pain , or abdominal swelling? No. ?Pain Score  0 * ? ?Have you tolerated food without any problems? Yes.   ? ?Have you been able to return to your normal activities? Yes.   ? ?Do you have any questions about your discharge instructions: ?Diet   No. ?Medications  No. ?Follow up visit  No. ? ?Do you have questions or concerns about your Care? No. ? ?Actions: ?* If pain score is 4 or above: ?No action needed, pain <4. ? ? ?

## 2021-03-29 NOTE — Telephone Encounter (Signed)
No answer for post procedure followup call. Left VM. ?

## 2021-04-05 ENCOUNTER — Telehealth: Payer: Self-pay | Admitting: Gastroenterology

## 2021-04-05 NOTE — Telephone Encounter (Signed)
Pt calling for results of EGD done 03/25/21. ?

## 2021-04-05 NOTE — Telephone Encounter (Signed)
Inbound call from patient would like a call back with results from 3/10 ?

## 2021-04-07 NOTE — Progress Notes (Signed)
Ms. Bobst,  ?The biopsies taken from your stomach were notable for mild foveolar hyperplasia which is a common finding and often related to use of certain medications (usually NSAIDs) or sometimes reflux of bile into the stomach, but there was no evidence of Helicobacter pylori infection. This common finding is not felt to necessarily be a cause of any particular symptom and there is no specific treatment or further evaluation recommended. ? ?The original pathology report included an error from the pathologist which included notes regarding another patient's skin biopsy results.  That error has since been corrected and is now accurate. ? ?Please follow-up with me in clinic as needed to discuss further evaluation of your symptoms. ?

## 2021-04-07 NOTE — Telephone Encounter (Signed)
See mychart  Message.

## 2021-04-08 ENCOUNTER — Encounter: Payer: 59 | Admitting: Gastroenterology

## 2021-07-20 ENCOUNTER — Other Ambulatory Visit: Payer: Self-pay | Admitting: Internal Medicine

## 2021-07-20 DIAGNOSIS — Z1231 Encounter for screening mammogram for malignant neoplasm of breast: Secondary | ICD-10-CM

## 2021-07-26 ENCOUNTER — Ambulatory Visit
Admission: RE | Admit: 2021-07-26 | Discharge: 2021-07-26 | Disposition: A | Discharge status: Home / Self Care | Payer: 59 | Source: Ambulatory Visit | Attending: Internal Medicine | Admitting: Internal Medicine

## 2021-07-26 DIAGNOSIS — Z1231 Encounter for screening mammogram for malignant neoplasm of breast: Secondary | ICD-10-CM

## 2022-02-08 ENCOUNTER — Encounter: Payer: Self-pay | Admitting: Nurse Practitioner

## 2022-02-08 ENCOUNTER — Ambulatory Visit (INDEPENDENT_AMBULATORY_CARE_PROVIDER_SITE_OTHER): Payer: 59 | Admitting: Nurse Practitioner

## 2022-02-08 VITALS — BP 116/80 | Ht 65.0 in | Wt 190.0 lb

## 2022-02-08 DIAGNOSIS — N951 Menopausal and female climacteric states: Secondary | ICD-10-CM | POA: Diagnosis not present

## 2022-02-08 DIAGNOSIS — Z01419 Encounter for gynecological examination (general) (routine) without abnormal findings: Secondary | ICD-10-CM

## 2022-02-08 DIAGNOSIS — N946 Dysmenorrhea, unspecified: Secondary | ICD-10-CM

## 2022-02-08 DIAGNOSIS — B009 Herpesviral infection, unspecified: Secondary | ICD-10-CM

## 2022-02-08 DIAGNOSIS — N921 Excessive and frequent menstruation with irregular cycle: Secondary | ICD-10-CM

## 2022-02-08 MED ORDER — VALACYCLOVIR HCL 500 MG PO TABS: 500.0000 mg | ORAL_TABLET | Freq: Two times a day (BID) | ORAL | 5 refills | Status: DC

## 2022-02-08 MED ORDER — IBUPROFEN 800 MG PO TABS: 800.0000 mg | ORAL_TABLET | Freq: Three times a day (TID) | ORAL | 3 refills | Status: DC | PRN

## 2022-02-08 NOTE — Progress Notes (Signed)
Jennifer Nolan 09/12/1970 [id number]   History:  52 y.o. G1P1001 presents for annual exam. Perimenopausal. Having cycles every 2-3 months, mild menopausal symptoms. Bleeding is heavy the first couple of days then tapers off with total bleeding time of  7 days. Tried POPs but had lower abdominal pain with use. Taking Ibuprofen with good management. Abnormal pap many years ago, no intervention required. HTN, GERD managed by PCP.  Gynecologic History Patient's last menstrual period was 01/03/2022 (approximate). Period Cycle (Days): 60 Period Duration (Days): 7 Period Pattern: (!) Irregular (skipping periods) Menstrual Flow: Heavy Menstrual Control: Maxi pad Dysmenorrhea: (!) Moderate (with LMP 12/2021) Dysmenorrhea Symptoms: Cramping Contraception/Family planning: abstinence Sexually active: Yes  Health Maintenance Last Pap: 12/18/2018. Results were: Normal, 5-year repeat Last mammogram: 07/26/2021. Results were: Normal Last colonoscopy: 01/21/2021. Results were: Normal, 10-year recall Last Dexa: Not indicated  Past medical history, past surgical history, family history and social history were all reviewed and documented in the EPIC chart. 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   ROS:  A ROS was performed and pertinent positives and negatives are included.  Exam:  Vitals:   02/08/22 0808  BP: 116/80  Weight: 190 lb (86.2 kg)  Height: 5\' 5"  (1.651 m)    Body mass index is 31.62 kg/m.  General appearance:  Normal Thyroid:  Symmetrical, normal in size, without palpable masses or nodularity. Respiratory  Auscultation:  Clear without wheezing or rhonchi Cardiovascular  Auscultation:  Regular rate, without rubs, murmurs or gallops  Edema/varicosities:  Not grossly evident Abdominal  Soft,nontender, without masses, guarding or rebound.  Liver/spleen:  No organomegaly noted  Hernia:  None appreciated  Skin  Inspection:  Grossly normal Breasts:  Examined lying and sitting.   Right: Without masses, retractions, nipple discharge or axillary adenopathy.   Left: Without masses, retractions, nipple discharge or axillary adenopathy. Genitourinary   Inguinal/mons:  Normal without inguinal adenopathy  External genitalia:  Normal appearing vulva with no masses, tenderness, or lesions  BUS/Urethra/Skene's glands:  Normal  Vagina:  Normal appearing with normal color and discharge, no lesions  Cervix:  Normal appearing without discharge or lesions  Uterus:  Normal in size, shape and contour.  Midline and mobile, nontender  Adnexa/parametria:     Rt: Normal in size, without masses or tenderness.   Lt: Normal in size, without masses or tenderness.  Anus and perineum: Normal  Digital rectal exam: Deferred  Patient informed chaperone available to be present for breast and pelvic exam. Patient has requested no chaperone to be present. Patient has been advised what will be completed during breast and pelvic exam.   Assessment/Plan:  52 y.o. G1P1001 for annual exam.   Well female exam with routine gynecological exam - Education provided on SBEs, importance of preventative screenings, current guidelines, high calcium diet, regular exercise, and multivitamin daily.  Labs with PCP.   Perimenopausal - Having cycles every 2-3 months, mild menopausal symptoms. Bleeding is heavy the first couple of days then tapers off with total bleeding time of  7 days Had lower abdominal pain with POPs. Good management with Ibuprofen. We discussed what to expect during perimenopause in regards to bleeding pattern and symptoms.  Dysmenorrhea - Plan: ibuprofen (ADVIL) 800 MG tablet every 8 hour as needed for menstrual cramping. Only needing to take once daily as needed.   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   Menorrhagia with irregular cycle - Plan: ibuprofen (ADVIL) 800  MG tablet every 8 hours as needed.   Screening  for cervical cancer - Abnormal pap many years ago, no intervention required. Will repeat at 5-year interval per guidelines.  Screening for breast cancer - Normal mammogram history.  Continue annual screenings.  Normal breast exam today.  Screening for colon cancer - 01/2021 - normal colonoscopy. Will repeat at 10-year interval per GI's recommendation.   Return in 1 year for annual.     Tamela Gammon DNP, 8:26 AM 02/08/2022

## 2022-08-28 ENCOUNTER — Other Ambulatory Visit: Payer: Self-pay | Admitting: Internal Medicine

## 2022-08-28 DIAGNOSIS — Z1231 Encounter for screening mammogram for malignant neoplasm of breast: Secondary | ICD-10-CM

## 2022-09-01 ENCOUNTER — Encounter: Payer: Self-pay | Admitting: Internal Medicine

## 2022-09-07 ENCOUNTER — Ambulatory Visit
Admission: RE | Admit: 2022-09-07 | Discharge: 2022-09-07 | Disposition: A | Discharge status: Home / Self Care | Payer: 59 | Source: Ambulatory Visit | Attending: Internal Medicine | Admitting: Internal Medicine

## 2022-09-07 DIAGNOSIS — Z1231 Encounter for screening mammogram for malignant neoplasm of breast: Secondary | ICD-10-CM

## 2022-09-21 ENCOUNTER — Other Ambulatory Visit: Payer: Self-pay | Admitting: Internal Medicine

## 2022-09-21 DIAGNOSIS — R109 Unspecified abdominal pain: Secondary | ICD-10-CM

## 2022-09-22 ENCOUNTER — Ambulatory Visit
Admission: RE | Admit: 2022-09-22 | Discharge: 2022-09-22 | Disposition: A | Discharge status: Home / Self Care | Payer: 59 | Source: Ambulatory Visit | Attending: Internal Medicine | Admitting: Internal Medicine

## 2022-09-22 DIAGNOSIS — R109 Unspecified abdominal pain: Secondary | ICD-10-CM

## 2022-09-22 MED ORDER — IOPAMIDOL (ISOVUE-300) INJECTION 61%
200.0000 mL | Freq: Once | INTRAVENOUS | Status: AC | PRN
  Administered 2022-09-22: 100 mL via INTRAVENOUS

## 2022-10-01 IMAGING — MG MM DIGITAL DIAGNOSTIC UNILAT*R* W/ TOMO W/ CAD
8 series · 8 of 24 positions shown · non-contrast
Comparison: Previous exam(s).

CLINICAL DATA: 49-year-old female for further evaluation of
possible RIGHT breast mass on screening mammogram.

EXAM:
DIGITAL DIAGNOSTIC UNILATERAL RIGHT MAMMOGRAM WITH TOMOSYNTHESIS AND
CAD
TECHNIQUE: Right digital diagnostic mammography and breast tomosynthesis was
performed. The images were evaluated with computer-aided detection.

[R MLO synth-2D (1 of 2)]
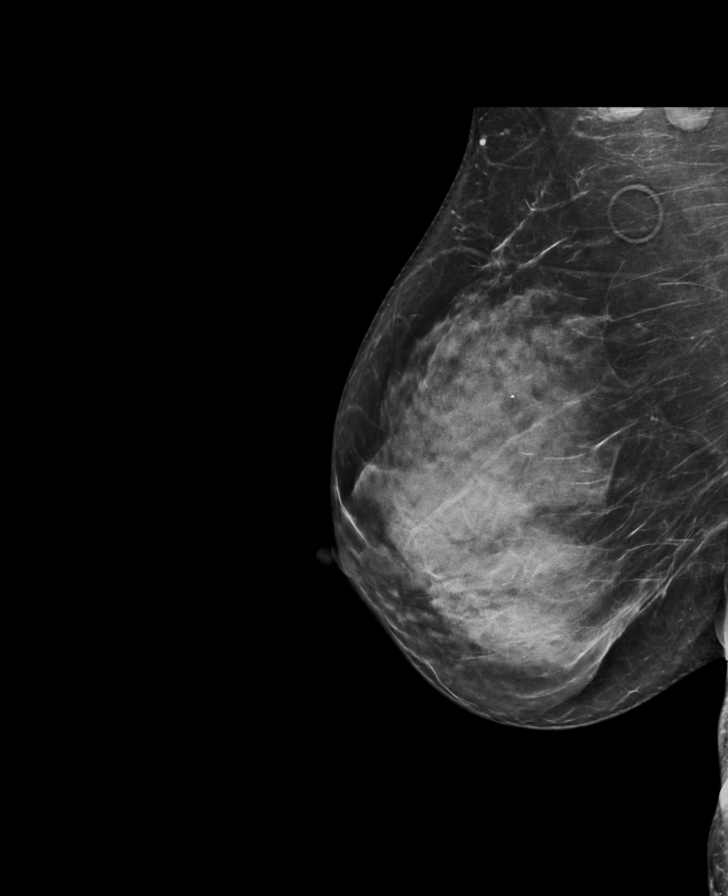

[R MLO synth-2D (2 of 2)]
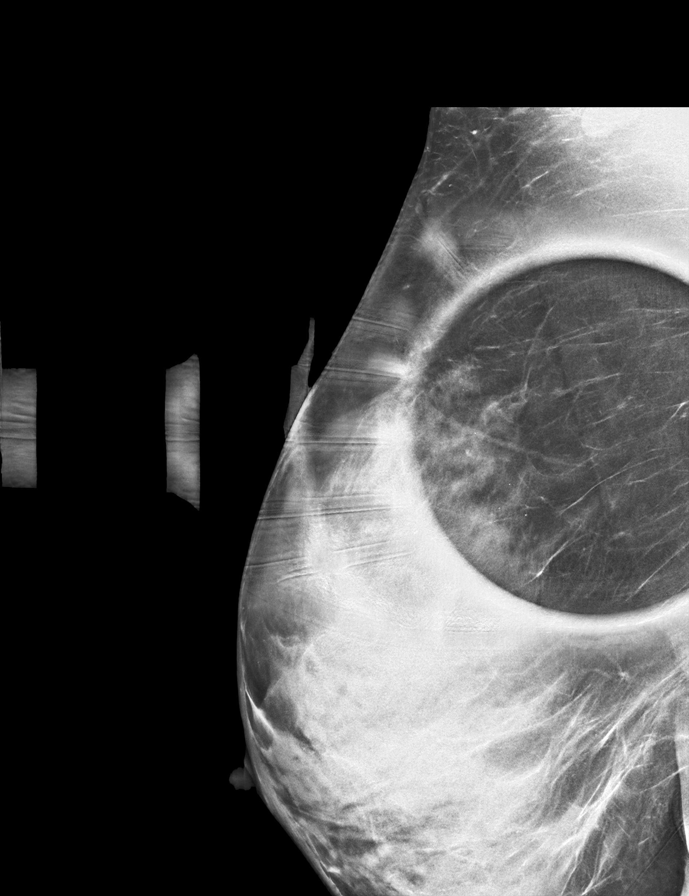

[R ML synth-2D]
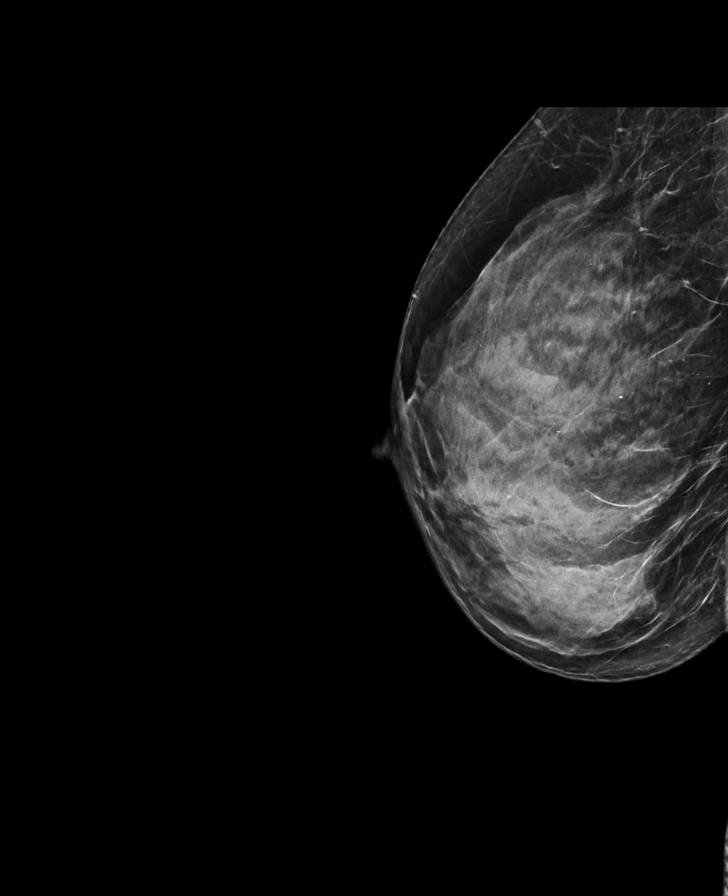

[R XCCL synth-2D]
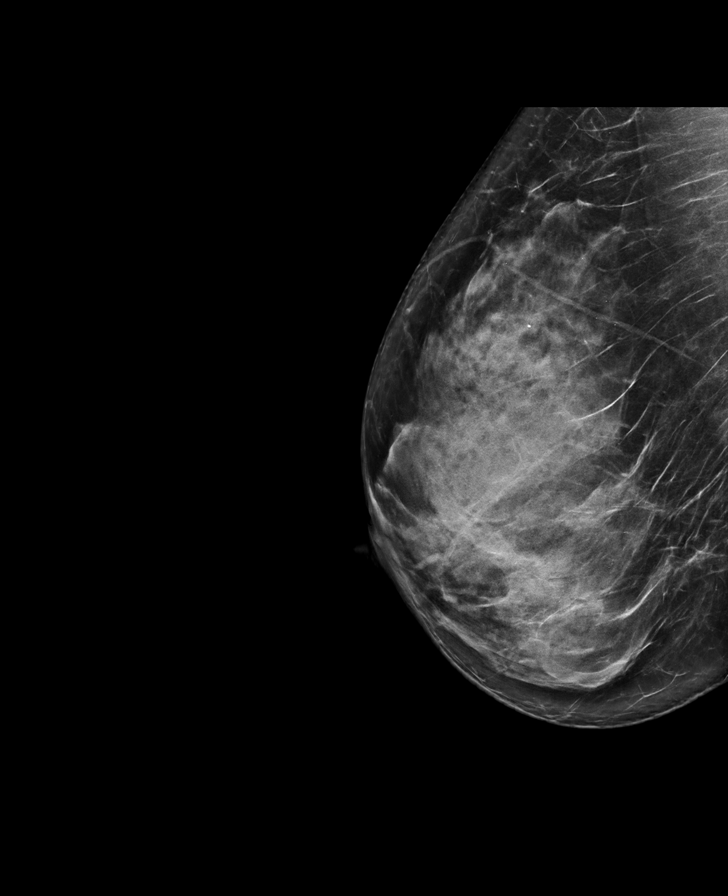

[R XCCL tomo · tomo slice 39/76.0]
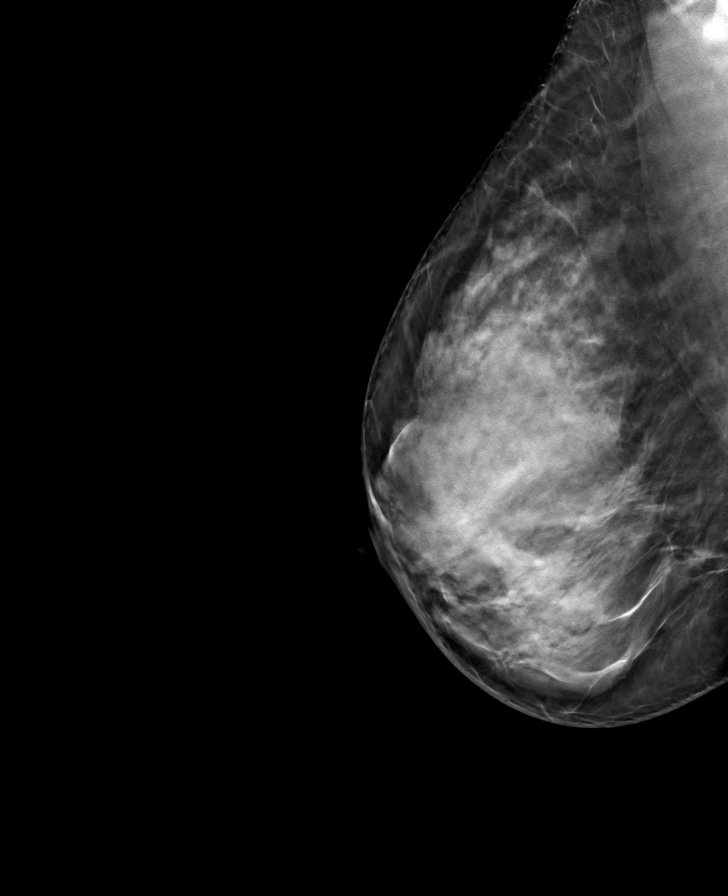

[R MLO tomo (1 of 2) · tomo slice 41/80.0]
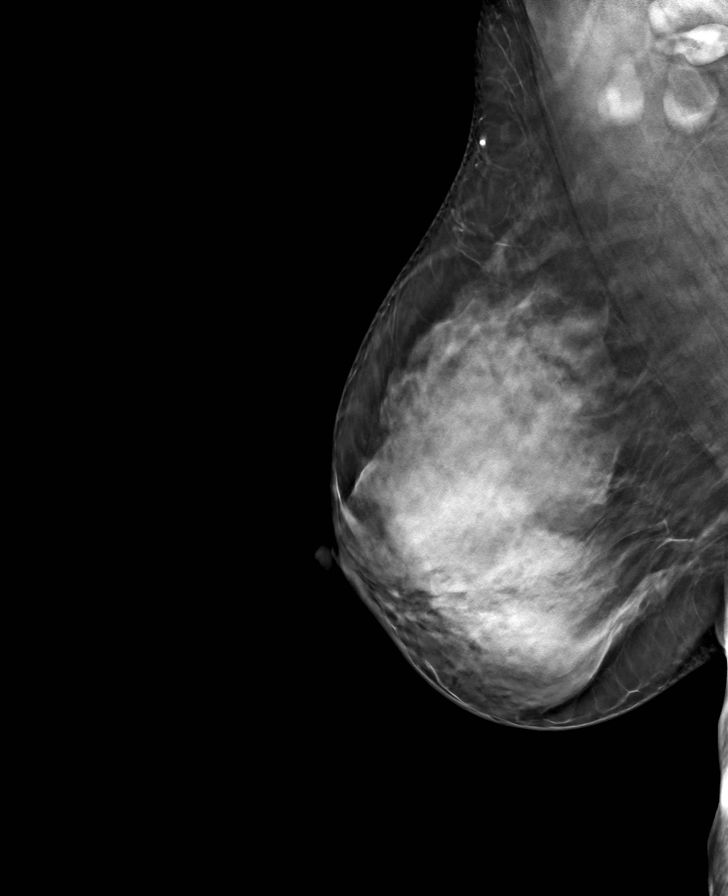

[R ML tomo · tomo slice 37/74.0]
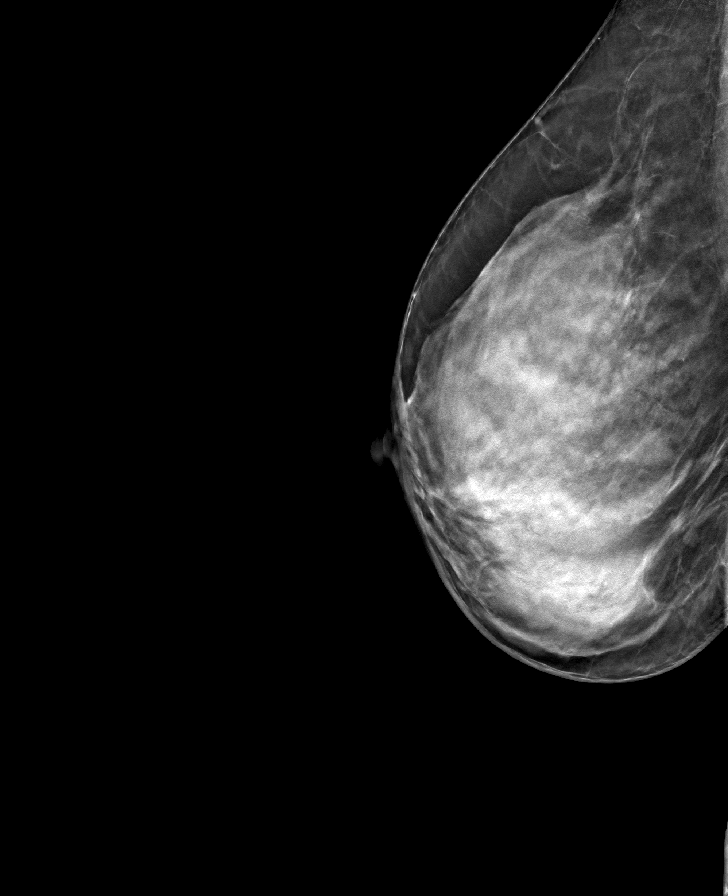

[R MLO tomo (2 of 2) · tomo slice 34/67.0]
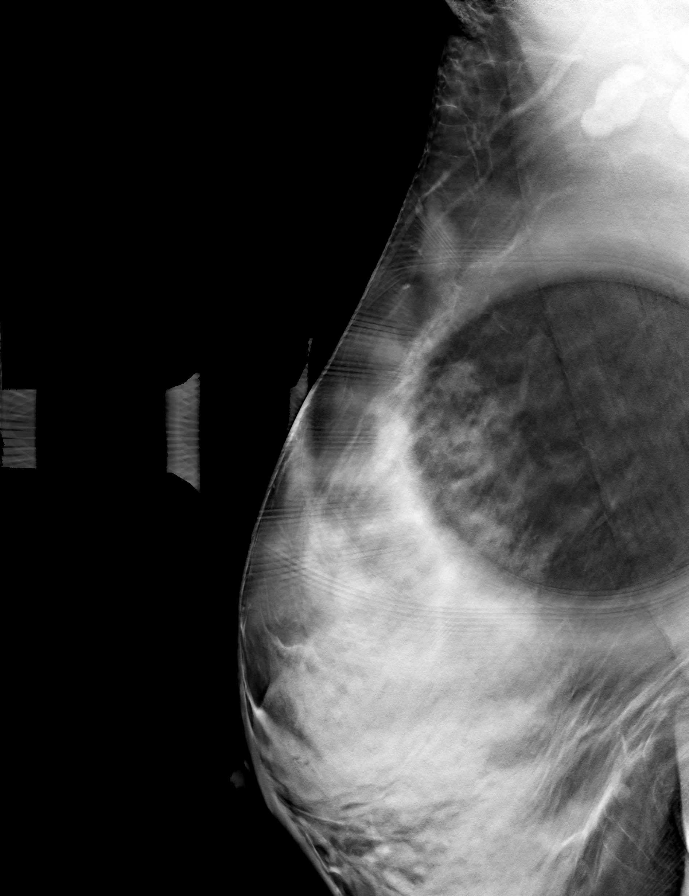

[8 of 24 positions shown; findings below may reference images not displayed]

ACR Breast Density Category d: The breast tissue is extremely dense,
which lowers the sensitivity of mammography.
FINDINGS: 2D/3D full field and spot compression views of the RIGHT breast
demonstrate no persistent abnormality at the site of the screening
study finding.
IMPRESSION: No persistent abnormality at the site of the screening study
finding.

RECOMMENDATION:
Bilateral screening mammogram in 1 year.

I have discussed the findings and recommendations with the patient.
If applicable, a reminder letter will be sent to the patient
regarding the next appointment.

BI-RADS CATEGORY  1: Negative.

## 2023-02-17 HISTORY — PX: WISDOM TOOTH EXTRACTION: SHX21

## 2023-02-26 ENCOUNTER — Ambulatory Visit: Payer: 59 | Admitting: Nurse Practitioner

## 2023-03-05 ENCOUNTER — Encounter: Payer: Self-pay | Admitting: Nurse Practitioner

## 2023-03-05 ENCOUNTER — Ambulatory Visit (INDEPENDENT_AMBULATORY_CARE_PROVIDER_SITE_OTHER): Payer: 59 | Admitting: Nurse Practitioner

## 2023-03-05 VITALS — BP 122/82 | HR 77 | Resp 14 | Ht 66.0 in | Wt 191.0 lb

## 2023-03-05 DIAGNOSIS — D219 Benign neoplasm of connective and other soft tissue, unspecified: Secondary | ICD-10-CM

## 2023-03-05 DIAGNOSIS — N921 Excessive and frequent menstruation with irregular cycle: Secondary | ICD-10-CM

## 2023-03-05 MED ORDER — NORETHINDRONE 0.35 MG PO TABS: 1.0000 | ORAL_TABLET | Freq: Every day | ORAL | 0 refills | Status: DC

## 2023-03-05 NOTE — Progress Notes (Signed)
   Acute Office Visit  Subjective:    Patient ID: Jennifer Nolan, female    DOB: 22-Feb-1970, 53 y.o.   MRN: [id number]   HPI 53 y.o. presents today for heavy periods. Has had heavy periods for a while but they have progressed over the last year. Skipped a few cycles in the fall, then had menses Oct, Nov and Dec, skipped Jan and LMP 02/21/23. First 2 days are very heavy, stays home due to worry of accidents, wears largest depends and changes every 2-3 hours. Then bleeding tapers off with total bleeding time of around 7 days. Cramping present and radiates down legs during menses. CT abdomen 09/2022 showed enlarged, fibroid uterus, progressed since 2018. H/O HTN. Remembers trying Norethindrone in the past and thinks it made her sleepy.   Patient's last menstrual period was 02/21/2023.    Review of Systems  Constitutional: Negative.   Genitourinary:  Positive for menstrual problem. Negative for pelvic pain.       Objective:    Physical Exam Constitutional:      Appearance: Normal appearance.   GU: Not indicated  BP 122/82   Pulse 77   Resp 14   Ht 5\' 6"  (1.676 m)   Wt 191 lb (86.6 kg)   LMP 02/21/2023   BMI 30.83 kg/m  Wt Readings from Last 3 Encounters:  03/05/23 191 lb (86.6 kg)  02/08/22 190 lb (86.2 kg)  03/25/21 185 lb (83.9 kg)         Assessment & Plan:   Problem List Items Addressed This Visit   None Visit Diagnoses       Menorrhagia with irregular cycle    -  Primary   Relevant Medications   norethindrone (ORTHO MICRONOR) 0.35 MG tablet   Other Relevant Orders   US PELVIS TRANSVAGINAL NON-OB (TV ONLY)     Fibroids       Relevant Orders   US PELVIS TRANSVAGINAL NON-OB (TV ONLY)      Plan: Reviewed CT scan and progression since 2018. Management options discussed - progestin-only birth control (POP or Depo, IUD and Nexplanon not recommended), Lysteda, and surgical options. Will avoid estrogen due to elevated blood pressures. Would like to try POPs again.  Will take at night if needed. Considering surgery. Will schedule ultrasound.     Olivia Mackie DNP, 1:53 PM 03/05/2023

## 2023-03-12 ENCOUNTER — Encounter: Payer: Self-pay | Admitting: Nurse Practitioner

## 2023-03-21 ENCOUNTER — Ambulatory Visit (INDEPENDENT_AMBULATORY_CARE_PROVIDER_SITE_OTHER): Payer: 59

## 2023-03-21 ENCOUNTER — Other Ambulatory Visit: Payer: Self-pay | Admitting: Nurse Practitioner

## 2023-03-21 ENCOUNTER — Ambulatory Visit (INDEPENDENT_AMBULATORY_CARE_PROVIDER_SITE_OTHER): Payer: 59 | Admitting: Nurse Practitioner

## 2023-03-21 VITALS — BP 116/70 | HR 68 | Wt 194.0 lb

## 2023-03-21 DIAGNOSIS — N946 Dysmenorrhea, unspecified: Secondary | ICD-10-CM | POA: Diagnosis not present

## 2023-03-21 DIAGNOSIS — D219 Benign neoplasm of connective and other soft tissue, unspecified: Secondary | ICD-10-CM | POA: Diagnosis not present

## 2023-03-21 DIAGNOSIS — D251 Intramural leiomyoma of uterus: Secondary | ICD-10-CM

## 2023-03-21 DIAGNOSIS — N840 Polyp of corpus uteri: Secondary | ICD-10-CM

## 2023-03-21 DIAGNOSIS — N921 Excessive and frequent menstruation with irregular cycle: Secondary | ICD-10-CM

## 2023-03-21 DIAGNOSIS — D252 Subserosal leiomyoma of uterus: Secondary | ICD-10-CM

## 2023-03-21 NOTE — Progress Notes (Signed)
   Acute Office Visit  Subjective:    Patient ID: Jennifer Nolan, female    DOB: 1970/03/12, 53 y.o.   MRN: [id number]   HPI 53 y.o. presents today for ultrasound for evaluation of heavy periods. Seen 03/05/23 with complaints of heavy periods for a while but they have progressed over the last year. Skipped a few cycles in the fall, then had menses Oct, Nov and Dec, skipped Jan and LMP 02/21/23. First 2 days are very heavy, stays home due to worry of accidents, wears largest depends and changes every 2-3 hours. Then bleeding tapers off with total bleeding time of around 7 days. Cramping present and radiates down legs during menses. CT abdomen 09/2022 showed enlarged, fibroid uterus, progressed since 2018. H/O HTN. Prescribed mini pill, planning to start with next menses.   Patient's last menstrual period was 02/21/2023.    Review of Systems  Constitutional: Negative.   Genitourinary:  Positive for menstrual problem.       Objective:    Physical Exam Constitutional:      Appearance: Normal appearance.   GU: Not indicated  BP 116/70   Pulse 68   Wt 194 lb (88 kg)   LMP 02/21/2023   SpO2 100%   BMI 31.31 kg/m  Wt Readings from Last 3 Encounters:  03/21/23 194 lb (88 kg)  03/05/23 191 lb (86.6 kg)  02/08/22 190 lb (86.2 kg)         Assessment & Plan:   Problem List Items Addressed This Visit       Genitourinary   Dysmenorrhea   Other Visit Diagnoses       Intramural and subserous leiomyoma of uterus    -  Primary     Endometrial polyp         Menorrhagia with irregular cycle          Transabdominal ultrasound: Anteverted uterus, enlarged with multiple intramural/subserosal fibroids, largest measuring 4.1 cm.  Endometrium 9 mm with benign 9 13 x 6 mm polypoid area in mid/fundal endometrium with what appears to be a feeder vessel.  Both ovaries within normal limits.  No adnexal masses, no free fluid.  Plan: Ultrasound findings reviewed - fibroids and endometrial  polyp. Will schedule surgical consult to discuss possible hysteroscopy with Myosure. Will start POPs with next menses for bleeding control.     Olivia Mackie DNP, 9:41 AM 03/21/2023

## 2023-03-24 ENCOUNTER — Other Ambulatory Visit: Payer: Self-pay | Admitting: Nurse Practitioner

## 2023-03-24 DIAGNOSIS — B009 Herpesviral infection, unspecified: Secondary | ICD-10-CM

## 2023-03-27 NOTE — Telephone Encounter (Signed)
 Med refill request: valacyclovir 500 mg #30 Last OV: 03/21/23 ultrasound check Last AEX: 02/08/22 Next AEX: 04/26/23 Last MMG (if hormonal med) n/a Refill authorized: valacyclovir 500 mg #30. Please approve or deny as appropriate.

## 2023-04-09 ENCOUNTER — Ambulatory Visit (INDEPENDENT_AMBULATORY_CARE_PROVIDER_SITE_OTHER): Admitting: Obstetrics and Gynecology

## 2023-04-09 ENCOUNTER — Encounter: Payer: Self-pay | Admitting: Obstetrics and Gynecology

## 2023-04-09 VITALS — BP 102/70 | HR 78

## 2023-04-09 DIAGNOSIS — N920 Excessive and frequent menstruation with regular cycle: Secondary | ICD-10-CM

## 2023-04-09 DIAGNOSIS — D219 Benign neoplasm of connective and other soft tissue, unspecified: Secondary | ICD-10-CM

## 2023-04-09 DIAGNOSIS — R935 Abnormal findings on diagnostic imaging of other abdominal regions, including retroperitoneum: Secondary | ICD-10-CM | POA: Diagnosis not present

## 2023-04-09 DIAGNOSIS — N840 Polyp of corpus uteri: Secondary | ICD-10-CM

## 2023-04-09 DIAGNOSIS — N946 Dysmenorrhea, unspecified: Secondary | ICD-10-CM

## 2023-04-09 NOTE — Progress Notes (Unsigned)
 Acute Office Visit  Subjective:    Patient ID: Jennifer Nolan, female    DOB: [birth year], 53 y.o.   MRN: [id number]  Patient presents for surgical consult from Wyline Beady, NP  Patient without a period for 5 months and then in October started having heavy, painful periods again. She has fibroids, endometrial polyp, thickened endometrial lining. She reports having to bring clothes with her and pads to change in on her cycle and would not want to leave the house. She has started the minipill and has some improvement but doesn't want to take pills or have multiple procedures in the future and would like a definitive treatment with the hysterectomy  HPI Per Jennifer Nolan's 53 y.o. presents today for ultrasound for evaluation of heavy periods. Seen 03/05/23 with complaints of heavy periods for a while but they have progressed over the last year. Skipped a few cycles in the fall, then had menses Oct, Nov and Dec, skipped Jan and LMP 02/21/23. First 2 days are very heavy, stays home due to worry of accidents, wears largest depends and changes every 2-3 hours. Then bleeding tapers off with total bleeding time of around 7 days. Cramping present and radiates down legs during menses. CT abdomen 09/2022 showed enlarged, fibroid uterus, progressed since 2018. H/O HTN. Prescribed mini pill, planning to start with next menses.   Patient's last menstrual period was 03/21/2023 (approximate).    Review of Systems  Constitutional: Negative.   Genitourinary:  Positive for menstrual problem.       Objective:    Physical Exam Constitutional:      Appearance: Normal appearance.  GU: Not indicated  BP 102/70   Pulse 78   LMP 03/21/2023 (Approximate)   SpO2 98%  Wt Readings from Last 3 Encounters:  03/21/23 194 lb (88 kg)  03/05/23 191 lb (86.6 kg)  02/08/22 190 lb (86.2 kg)         Assessment & Plan:   Problem List Items Addressed This Visit       Genitourinary   Dysmenorrhea   Relevant  Orders   Endometrial biopsy   Ambulatory Referral For Surgery Scheduling     Other   Menorrhagia with regular cycle - Primary   Relevant Orders   Endometrial biopsy   Ambulatory Referral For Surgery Scheduling   Other Visit Diagnoses       Endometrial polyp       Relevant Orders   Endometrial biopsy   Ambulatory Referral For Surgery Scheduling     Abnormal ultrasound of endometrium       Relevant Orders   Endometrial biopsy   Ambulatory Referral For Surgery Scheduling     Fibroids       Relevant Orders   Endometrial biopsy   Ambulatory Referral For Surgery Scheduling      Transabdominal ultrasound: Anteverted uterus, enlarged with multiple intramural/subserosal fibroids, largest measuring 4.1 cm.  Endometrium 9 mm with benign 9 13 x 6 mm polypoid area in mid/fundal endometrium with what appears to be a feeder vessel.  Both ovaries within normal limits.  No adnexal masses, no free fluid.  Plan:  Menorrhagia, dysmenorrhea, enlarging fibroid uterus in perimenopausal period, endometrial polyp  Symptomatic fibroid uterus:  Counseled on all options such as hormonal, nonhormonal and RLH.  I would not recommend ablation as there is a risk for entrapped cancer in the future.  May consider myosure D&C, IUD but may still have recurrent bleeding in the future and does not take care of  the fibroids that are slightly enlarged.  She would like to have the [facility]  Counseled extensively on the procedure including but not limited to what to expect and risks and benefits.  Counseled on postop care and pelvic rest for 10 weeks after the surgery with restricted lifting for 6 weeks after.  Counseled on the benefits of the robotic procedure with faster return to daily activities, improved outcomes, and less risk for complications. She would like to have this scheduled. She will return for an EMB.  Patient encouraged to take motrin prior to the procedure.  30 minutes spent on reviewing records, imaging,   and one on one patient time and counseling patient and documentation Dr. Judith Blonder DNP, 12:06 PM 04/09/2023

## 2023-04-26 ENCOUNTER — Other Ambulatory Visit (HOSPITAL_COMMUNITY)
Admission: RE | Admit: 2023-04-26 | Discharge: 2023-04-26 | Disposition: A | Discharge status: Home / Self Care | Source: Ambulatory Visit | Attending: Nurse Practitioner | Admitting: Nurse Practitioner

## 2023-04-26 ENCOUNTER — Ambulatory Visit: Payer: 59 | Admitting: Nurse Practitioner

## 2023-04-26 ENCOUNTER — Encounter: Payer: Self-pay | Admitting: Nurse Practitioner

## 2023-04-26 VITALS — BP 92/68 | HR 84 | Ht 65.5 in | Wt 194.0 lb

## 2023-04-26 DIAGNOSIS — D252 Subserosal leiomyoma of uterus: Secondary | ICD-10-CM

## 2023-04-26 DIAGNOSIS — Z1331 Encounter for screening for depression: Secondary | ICD-10-CM

## 2023-04-26 DIAGNOSIS — B009 Herpesviral infection, unspecified: Secondary | ICD-10-CM

## 2023-04-26 DIAGNOSIS — N921 Excessive and frequent menstruation with irregular cycle: Secondary | ICD-10-CM

## 2023-04-26 DIAGNOSIS — Z01419 Encounter for gynecological examination (general) (routine) without abnormal findings: Secondary | ICD-10-CM | POA: Diagnosis not present

## 2023-04-26 DIAGNOSIS — N946 Dysmenorrhea, unspecified: Secondary | ICD-10-CM | POA: Diagnosis not present

## 2023-04-26 DIAGNOSIS — Z124 Encounter for screening for malignant neoplasm of cervix: Secondary | ICD-10-CM

## 2023-04-26 DIAGNOSIS — D251 Intramural leiomyoma of uterus: Secondary | ICD-10-CM | POA: Diagnosis not present

## 2023-04-26 MED ORDER — NORETHINDRONE 0.35 MG PO TABS: 1.0000 | ORAL_TABLET | Freq: Every day | ORAL | 0 refills | Status: DC

## 2023-04-26 NOTE — Progress Notes (Signed)
 Veora Fonte Parslow [birth year] [id number]   History:  53 y.o. G1P1001 presents for annual exam. In process of schedule hysterectomy for management of menorrhagia s/t fibroids. Also has polyp and thickened lining. POPs. Abnormal pap many years ago, no intervention required. HTN, GERD managed by PCP.  Gynecologic History Patient's last menstrual period was 03/21/2023 (approximate).   Contraception/Family planning: abstinence Sexually active: Yes  Health Maintenance Last Pap: 12/18/2018. Results were: Normal neg HPV Last mammogram: 09/07/2022. Results were: Normal Last colonoscopy: 01/21/2021. Results were: Normal, 10-year recall Last Dexa: Not indicated     04/26/2023    8:09 AM  Depression screen PHQ 2/9  Decreased Interest 1  Down, Depressed, Hopeless 1  PHQ - 2 Score 2  Altered sleeping 1  Tired, decreased energy 1  Change in appetite 0  Feeling bad or failure about yourself  1  Trouble concentrating 1  Moving slowly or fidgety/restless 1  Suicidal thoughts 0  PHQ-9 Score 7     Past medical history, past surgical history, family history and social history were all reviewed and documented in the EPIC chart. 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   ROS:  A ROS was performed and pertinent positives and negatives are included.  Exam:  Vitals:   04/26/23 0810  BP: 92/68  Pulse: 84  SpO2: 99%  Weight: 194 lb (88 kg)  Height: 5' 5.5" (1.664 m)     Body mass index is 31.79 kg/m.  General appearance:  Normal Thyroid:  Symmetrical, normal in size, without palpable masses or nodularity. Respiratory  Auscultation:  Clear without wheezing or rhonchi Cardiovascular  Auscultation:  Regular rate, without rubs, murmurs or gallops  Edema/varicosities:  Not grossly evident Abdominal  Soft,nontender, without masses, guarding or rebound.  Liver/spleen:  No organomegaly noted  Hernia:  None appreciated  Skin  Inspection:  Grossly normal Breasts:  Examined lying and sitting.   Right: Without masses, retractions, nipple discharge or axillary adenopathy.   Left: Without masses, retractions, nipple discharge or axillary adenopathy. Pelvic: External genitalia:  no lesions              Urethra:  normal appearing urethra with no masses, tenderness or lesions              Bartholins and Skenes: normal                 Vagina: normal appearing vagina with normal color and discharge, no lesions              Cervix: no lesions Bimanual Exam:  Uterus:  no masses or tenderness              Adnexa: no mass, fullness, tenderness              Rectovaginal: Deferred              Anus:  normal, no lesions  Patient informed chaperone available to be present for breast and pelvic exam. Patient has requested no chaperone to be present. Patient has been advised what will be completed during breast and pelvic exam.   Assessment/Plan:  53 y.o. G1P1001 for annual exam.   Well female exam with routine gynecological exam - Education provided on SBEs, importance of preventative screenings, current guidelines, high calcium diet, regular exercise, and multivitamin daily.  Labs with PCP.   Cervical cancer screening - Plan: Cytology - PAP( [place]). Abnormal pap many years ago, no intervention required. Pap today per  guidelines.   Dysmenorrhea - Plan: norethindrone (ORTHO MICRONOR) 0.35 MG tablet daily.   Intramural and subserous leiomyoma of uterus - in process of scheduling hysterectomy.   HSV-2 infection - Valtrex as needed  Menorrhagia with irregular cycle - Plan: norethindrone (ORTHO MICRONOR) 0.35 MG tablet daily.   Screening for breast cancer - Normal mammogram history.  Continue annual screenings.  Normal breast exam today.  Screening for colon cancer - 01/2021 - normal colonoscopy. Will repeat at 10-year interval per GI's recommendation.   Return in about 1 year (around 04/25/2024) for Annual.    Olivia Mackie DNP, 8:28 AM 04/26/2023

## 2023-05-02 LAB — CYTOLOGY - PAP
Adequacy: ABSENT
Comment: NEGATIVE
Diagnosis: NEGATIVE
High risk HPV: NEGATIVE

## 2023-05-03 ENCOUNTER — Encounter: Payer: Self-pay | Admitting: Nurse Practitioner

## 2023-05-15 ENCOUNTER — Other Ambulatory Visit: Payer: Self-pay | Admitting: Nurse Practitioner

## 2023-05-15 DIAGNOSIS — B009 Herpesviral infection, unspecified: Secondary | ICD-10-CM

## 2023-05-16 NOTE — Telephone Encounter (Signed)
 Med refill request: valacyclovir  500mg   Last AEX: 04/26/23 Next AEX: n/a Last MMG (if hormonal med) n/a Refill authorized: valacyclovir  500mg  Please approve or deny as appropriate.

## 2023-05-17 ENCOUNTER — Other Ambulatory Visit (HOSPITAL_COMMUNITY)
Admission: RE | Admit: 2023-05-17 | Discharge: 2023-05-17 | Disposition: A | Discharge status: Home / Self Care | Source: Ambulatory Visit | Attending: Obstetrics and Gynecology | Admitting: Obstetrics and Gynecology

## 2023-05-17 ENCOUNTER — Encounter: Payer: Self-pay | Admitting: Obstetrics and Gynecology

## 2023-05-17 ENCOUNTER — Ambulatory Visit (INDEPENDENT_AMBULATORY_CARE_PROVIDER_SITE_OTHER): Admitting: Obstetrics and Gynecology

## 2023-05-17 VITALS — BP 110/80 | HR 75

## 2023-05-17 DIAGNOSIS — D219 Benign neoplasm of connective and other soft tissue, unspecified: Secondary | ICD-10-CM | POA: Diagnosis not present

## 2023-05-17 DIAGNOSIS — N921 Excessive and frequent menstruation with irregular cycle: Secondary | ICD-10-CM | POA: Insufficient documentation

## 2023-05-17 DIAGNOSIS — N946 Dysmenorrhea, unspecified: Secondary | ICD-10-CM | POA: Diagnosis not present

## 2023-05-17 DIAGNOSIS — N920 Excessive and frequent menstruation with regular cycle: Secondary | ICD-10-CM | POA: Insufficient documentation

## 2023-05-17 DIAGNOSIS — N898 Other specified noninflammatory disorders of vagina: Secondary | ICD-10-CM

## 2023-05-17 DIAGNOSIS — Z01812 Encounter for preprocedural laboratory examination: Secondary | ICD-10-CM

## 2023-05-17 DIAGNOSIS — R935 Abnormal findings on diagnostic imaging of other abdominal regions, including retroperitoneum: Secondary | ICD-10-CM

## 2023-05-17 DIAGNOSIS — N840 Polyp of corpus uteri: Secondary | ICD-10-CM

## 2023-05-17 LAB — PREGNANCY, URINE: Preg Test, Ur: NEGATIVE

## 2023-05-17 NOTE — Progress Notes (Signed)
   Acute Office Visit  Subjective:    Patient ID: Jennifer Nolan, female    DOB: 1970-04-20, 53 y.o.   MRN: [id number]  Patient presents EMB prior to [facility], bilateral salpingectomy, cystoscopy  for surgical consult from Jennifer Klinefelter, NP  Patient without a period for 5 months and then in October started having heavy, painful periods again. She has fibroids, endometrial polyp, thickened endometrial lining. She reports having to bring clothes with her and pads to change in on her cycle and would not want to leave the house. She has started the minipill and has some improvement but doesn't want to take pills or have multiple procedures in the future and would like a definitive treatment with the hysterectomy  HPI Per Jennifer Nolan's 53 y.o. presents today for ultrasound for evaluation of heavy periods. Seen 03/05/23 with complaints of heavy periods for a while but they have progressed over the last year. Skipped a few cycles in the fall, then had menses Oct, Nov and Dec, skipped Jan and LMP 02/21/23. First 2 days are very heavy, stays home due to worry of accidents, wears largest depends and changes every 2-3 hours. Then bleeding tapers off with total bleeding time of around 7 days. Cramping present and radiates down legs during menses. CT abdomen 09/2022 showed enlarged, fibroid uterus, progressed since 2018. H/O HTN. Prescribed mini pill, planning to start with next menses.   No LMP recorded.    Review of Systems  Constitutional: Negative.   Genitourinary:  Positive for menstrual problem.       Objective:    Physical Exam Constitutional:      Appearance: Normal appearance.  GU: Not indicated  There were no vitals taken for this visit. Wt Readings from Last 3 Encounters:  04/26/23 194 lb (88 kg)  03/21/23 194 lb (88 kg)  03/05/23 191 lb (86.6 kg)         Assessment & Plan:   Problem List Items Addressed This Visit   None Visit Diagnoses       Encounter for preprocedural  laboratory examination    -  Primary   Relevant Orders   Pregnancy, urine      Transabdominal ultrasound: Anteverted uterus, enlarged with multiple intramural/subserosal fibroids, largest measuring 4.1 cm.  Endometrium 9 mm with benign 9 13 x 6 mm polypoid area in mid/fundal endometrium with what appears to be a feeder vessel.  Both ovaries within normal limits.  No adnexal masses, no free fluid.    PROCEDURE: EMB Consent obtained for the procedure.  Time out performed. A bivalve speculum was placed in the vagina.  The cervix was grasped with a single tooth tenaculum. Uterus sound to 12cm.  Pipelle was inserted and rotated.  Adequate specimen was obtained and sent to pathology.  All instruments were removed.  Patient tolerated the procedure well.  To notify patient of the results. Possible discharge c/w yeast. Nuswab collected Plan Menorrhagia, dysmenorrhea, enlarging fibroid uterus in perimenopausal period, endometrial polyp  EMB collected. Patient is ready to schedule her surgery.  To send to Jill for scheduling. To notify patient of results.  Dr. Caro Christmas DNP, 11:38 AM 05/17/2023

## 2023-05-17 NOTE — Addendum Note (Signed)
 Addended by: Reinaldo Caras on: 05/17/2023 04:07 PM   Modules accepted: Orders

## 2023-05-18 LAB — SURESWAB® ADVANCED VAGINITIS PLUS,TMA
C. trachomatis RNA, TMA: NOT DETECTED
CANDIDA SPECIES: NOT DETECTED
Candida glabrata: NOT DETECTED
N. gonorrhoeae RNA, TMA: NOT DETECTED
SURESWAB(R) ADV BACTERIAL VAGINOSIS(BV),TMA: NEGATIVE
TRICHOMONAS VAGINALIS (TV),TMA: NOT DETECTED

## 2023-05-21 LAB — SURGICAL PATHOLOGY

## 2023-05-22 ENCOUNTER — Encounter: Payer: Self-pay | Admitting: Obstetrics and Gynecology

## 2023-05-22 NOTE — Addendum Note (Signed)
 Addended by: Reinaldo Caras on: 05/22/2023 03:13 PM   Modules accepted: Orders

## 2023-05-30 ENCOUNTER — Encounter: Payer: Self-pay | Admitting: Obstetrics and Gynecology

## 2023-05-30 ENCOUNTER — Ambulatory Visit (INDEPENDENT_AMBULATORY_CARE_PROVIDER_SITE_OTHER): Admitting: Obstetrics and Gynecology

## 2023-05-30 VITALS — BP 114/70 | HR 74 | Ht 65.25 in | Wt 191.0 lb

## 2023-05-30 DIAGNOSIS — N921 Excessive and frequent menstruation with irregular cycle: Secondary | ICD-10-CM | POA: Diagnosis not present

## 2023-05-30 DIAGNOSIS — Z01818 Encounter for other preprocedural examination: Secondary | ICD-10-CM

## 2023-05-30 DIAGNOSIS — N946 Dysmenorrhea, unspecified: Secondary | ICD-10-CM | POA: Diagnosis not present

## 2023-05-30 MED ORDER — METOCLOPRAMIDE HCL 10 MG PO TABS: 10.0000 mg | ORAL_TABLET | Freq: Three times a day (TID) | ORAL | 0 refills | Status: DC | PRN

## 2023-05-30 MED ORDER — OXYCODONE HCL 5 MG PO TABS: 5.0000 mg | ORAL_TABLET | ORAL | 0 refills | Status: DC | PRN

## 2023-05-30 MED ORDER — IBUPROFEN 800 MG PO TABS: 800.0000 mg | ORAL_TABLET | Freq: Three times a day (TID) | ORAL | 3 refills | Status: AC | PRN

## 2023-05-30 MED ORDER — ESTRADIOL 0.1 MG/GM VA CREA: 1.0000 | TOPICAL_CREAM | Freq: Every day | VAGINAL | 0 refills | Status: DC

## 2023-05-30 NOTE — Progress Notes (Signed)
 PREOP H&P for [facility], bilateral salpingectomy, cystoscopy  Subjective:    Patient ID: Jennifer Nolan, female    DOB: May 10, 1970, 53 y.o.   MRN: [id number]  Patient presents for preop for [facility], bilateral salpingectomy, cystoscopy  for surgical consult from Antonio Klinefelter, NP  Patient without a period for 5 months and then in October started having heavy, painful periods again. She has fibroids, endometrial polyp, thickened endometrial lining. She reports having to bring clothes with her and pads to change in on her cycle and would not want to leave the house. She has started the minipill and has some improvement but doesn't want to take pills or have multiple procedures in the future and would like a definitive treatment with the hysterectomy  HPI Per [name]'s 53 y.o. presents today for ultrasound for evaluation of heavy periods. Seen 03/05/23 with complaints of heavy periods for a while but they have progressed over the last year. Skipped a few cycles in the fall, then had menses Oct, Nov and Dec, skipped Jan and LMP 02/21/23. First 2 days are very heavy, stays home due to worry of accidents, wears largest depends and changes every 2-3 hours. Then bleeding tapers off with total bleeding time of around 7 days. Cramping present and radiates down legs during menses. CT abdomen 09/2022 showed enlarged, fibroid uterus, progressed since 2018. H/O HTN. Prescribed mini pill, planning to start with next menses.   Patient's last menstrual period was 03/21/2023 (exact date).    Review of Systems  Constitutional: Negative.   Genitourinary:  Positive for menstrual problem.       Objective:    Physical Exam Constitutional:      Appearance: Normal appearance.  GU: Not indicated  BP 114/70   Pulse 74   Ht 5' 5.25" (1.657 m)   Wt 191 lb (86.6 kg)   LMP 03/21/2023 (Exact Date)   SpO2 99%   BMI 31.54 kg/m  Wt Readings from Last 3 Encounters:  05/30/23 191 lb (86.6 kg)  04/26/23 194 lb (88 kg)   03/21/23 194 lb (88 kg)         Assessment & Plan:   Problem List Items Addressed This Visit   None   Transabdominal ultrasound: Anteverted uterus, enlarged with multiple intramural/subserosal fibroids, largest measuring 4.1 cm.  Endometrium 9 mm with benign 9 13 x 6 mm polypoid area in mid/fundal endometrium with what appears to be a feeder vessel.  Both ovaries within normal limits.  No adnexal masses, no free fluid.  Component Ref Range & Units (hover) 13 d ago  SURGICAL PATHOLOGY SURGICAL PATHOLOGY CASE: [id number] PATIENT: Jennifer Nolan Surgical Pathology Report     Clinical History: menorrhagia with irregular cycle, dysmenorrhea, endometrial polyp, fibroids, menorrhagia with regular cycle (cm)     FINAL MICROSCOPIC DIAGNOSIS:  A. ENDOMETRIUM, BIOPSY: - Endometrial polyp. - Negative for atypia/EIN and malignancy.   GROSS DESCRIPTION:  Specimen is received in formalin and consists of a 1.6 x 1.3 x 0.2 cm aggregate of tan-brown soft tissue.  The specimen is entirely submitted in 1 cassette.  Jeffrey Mini 05/18/2023)  Final Diagnosis performed by Teresia Fennel, MD.   Electronically signed 05/21/2023 Technical component performed at [facility], [address], [place], [place] [address].  Professional component performed at [facility] [address] [place], [place] [address]  Immunohistochemistry Technical component (if applicable) was performed at [facility]. [address], [place], [place] [address].  IMMUNOHISTOCHEMISTRY DISCLAIMER (if  applicable): Some of these immunohistochemical stains may have been developed and the performance characteristics determine by [facility] Some may not have been cleared or approved by the U.S. Food and Drug Administration. The FDA has determined that such clearance or approval is not necessary. This test is used for clinical purposes. It  should not be regarded as investigational or for research. This laboratory is certified under the Clinical Laboratory Improvement Amendments of 1988 (CLIA-88) as qualified to perform high complexity clinical laboratory testing.  The controls stained appropriately.    EMB sound to 12 cm  Past Medical History:  Diagnosis Date   Abnormal Pap smear    Arthritis    in spine   GERD (gastroesophageal reflux disease)    Hypertension    STD (sexually transmitted disease)    HSV2   Thyroid  cyst    drained it   Past Surgical History:  Procedure Laterality Date   BIOPSY THYROID      TOOTH EXTRACTION  2022   TOOTH EXTRACTION     Social History   Socioeconomic History   Marital status: Married    Spouse name: Not on file   Number of children: Not on file   Years of education: Not on file   Highest education level: Not on file  Occupational History   Not on file  Tobacco Use   Smoking status: Never    Passive exposure: Never   Smokeless tobacco: Never  Vaping Use   Vaping status: Never Used  Substance and Sexual Activity   Alcohol use: Yes    Comment: Occassional   Drug use: No   Sexual activity: Not Currently    Partners: Male    Birth control/protection: Abstinence, Pill    Comment: menarche 53yo, sexual  debut 53yo  Other Topics Concern   Not on file  Social History Narrative   Not on file   Social Drivers of Health   Financial Resource Strain: Not on file  Food Insecurity: Not on file  Transportation Needs: Not on file  Physical Activity: Not on file  Stress: Not on file  Social Connections: Not on file  Intimate Partner Violence: Not on file   OB History     Gravida  1   Para  1   Term  1   Preterm      AB      Living  1      SAB      IAB      Ectopic      Multiple      Live Births  1          Current Outpatient Medications on File Prior to Visit  Medication Sig Dispense Refill   Cetirizine HCl (ZYRTEC ALLERGY) 10 MG CAPS as  needed.     hydrochlorothiazide (HYDRODIURIL) 25 MG tablet TK 1 T PO D FOR BP     Multiple Vitamin (MULTIVITAMIN PO) Take by mouth.     naproxen (EC NAPROSYN) 500 MG EC tablet Take 500 mg by mouth 2 (two) times daily with a meal.     norethindrone  (ORTHO MICRONOR ) 0.35 MG tablet Take 1 tablet (0.35 mg total) by mouth daily. 84 tablet 0   Omega-3 Fatty Acids (OMEGA 3 PO) Take by mouth.     Polyethyl Glycol-Propyl Glycol (SYSTANE) 0.4-0.3 % GEL ophthalmic gel Place 1 Application into both eyes.     valACYclovir  (VALTREX ) 500 MG tablet TAKE 1 TABLET BY MOUTH TWICE  DAILY WHEN OUTBREAK  OCCURS FOR 3 TO 5 DAYS 30 tablet 0   VITAMIN D  PO Take by mouth.     No current facility-administered medications on file prior to visit.   No Known Allergies  Family History  Problem Relation Age of Onset   Hypertension Mother    Diabetes Mother    Hypertension Maternal Grandmother    Breast cancer Neg Hx    Colon polyps Neg Hx    Colon cancer Neg Hx    Esophageal cancer Neg Hx    Liver cancer Neg Hx    Pancreatic cancer Neg Hx    Stomach cancer Neg Hx      Plan Menorrhagia, dysmenorrhea, enlarging fibroid uterus in perimenopausal period, endometrial polyp here today for preop for [facility]   The risks of surgery were discussed in detail with the patient including but not limited to: bleeding which may require transfusion or reoperation; infection which may require prolonged hospitalization or re-hospitalization and antibiotic therapy; injury to bowel, bladder, ureters and major vessels or other surrounding organs which may lead to other procedures; formation of adhesions; need for additional procedures including laparotomy or subsequent procedures secondary to intraoperative injury or abnormal pathology; thromboembolic phenomenon; incisional problems and other postoperative or anesthesia complications.  The postoperative expectations were also discussed in detail. The patient also understands the alternative  treatment options which were discussed in full. All questions were answered.  Patient would like to proceed with the procedure.  To begin estrace and explained how this will help with vaginal cuff healing and bladder support in the future. To begin now with twice daily use and then daily after surgery until instructed otherwise. Dr. Tia Flowers   20 minutes spent on reviewing records, imaging,  and one on one patient time and counseling patient and documentation Dr. Caro Christmas DNP, 9:33 AM 05/30/2023

## 2023-05-30 NOTE — H&P (View-Only) (Signed)
 PREOP H&P for [facility], bilateral salpingectomy, cystoscopy  Subjective:    Patient ID: Jennifer Nolan, female    DOB: May 10, 1970, 53 y.o.   MRN: [id number]  Patient presents for preop for [facility], bilateral salpingectomy, cystoscopy  for surgical consult from Antonio Klinefelter, NP  Patient without a period for 5 months and then in October started having heavy, painful periods again. She has fibroids, endometrial polyp, thickened endometrial lining. She reports having to bring clothes with her and pads to change in on her cycle and would not want to leave the house. She has started the minipill and has some improvement but doesn't want to take pills or have multiple procedures in the future and would like a definitive treatment with the hysterectomy  HPI Per [name]'s 53 y.o. presents today for ultrasound for evaluation of heavy periods. Seen 03/05/23 with complaints of heavy periods for a while but they have progressed over the last year. Skipped a few cycles in the fall, then had menses Oct, Nov and Dec, skipped Jan and LMP 02/21/23. First 2 days are very heavy, stays home due to worry of accidents, wears largest depends and changes every 2-3 hours. Then bleeding tapers off with total bleeding time of around 7 days. Cramping present and radiates down legs during menses. CT abdomen 09/2022 showed enlarged, fibroid uterus, progressed since 2018. H/O HTN. Prescribed mini pill, planning to start with next menses.   Patient's last menstrual period was 03/21/2023 (exact date).    Review of Systems  Constitutional: Negative.   Genitourinary:  Positive for menstrual problem.       Objective:    Physical Exam Constitutional:      Appearance: Normal appearance.  GU: Not indicated  BP 114/70   Pulse 74   Ht 5' 5.25" (1.657 m)   Wt 191 lb (86.6 kg)   LMP 03/21/2023 (Exact Date)   SpO2 99%   BMI 31.54 kg/m  Wt Readings from Last 3 Encounters:  05/30/23 191 lb (86.6 kg)  04/26/23 194 lb (88 kg)   03/21/23 194 lb (88 kg)         Assessment & Plan:   Problem List Items Addressed This Visit   None   Transabdominal ultrasound: Anteverted uterus, enlarged with multiple intramural/subserosal fibroids, largest measuring 4.1 cm.  Endometrium 9 mm with benign 9 13 x 6 mm polypoid area in mid/fundal endometrium with what appears to be a feeder vessel.  Both ovaries within normal limits.  No adnexal masses, no free fluid.  Component Ref Range & Units (hover) 13 d ago  SURGICAL PATHOLOGY SURGICAL PATHOLOGY CASE: [id number] PATIENT: Jennifer Nolan Surgical Pathology Report     Clinical History: menorrhagia with irregular cycle, dysmenorrhea, endometrial polyp, fibroids, menorrhagia with regular cycle (cm)     FINAL MICROSCOPIC DIAGNOSIS:  A. ENDOMETRIUM, BIOPSY: - Endometrial polyp. - Negative for atypia/EIN and malignancy.   GROSS DESCRIPTION:  Specimen is received in formalin and consists of a 1.6 x 1.3 x 0.2 cm aggregate of tan-brown soft tissue.  The specimen is entirely submitted in 1 cassette.  Jeffrey Mini 05/18/2023)  Final Diagnosis performed by Teresia Fennel, MD.   Electronically signed 05/21/2023 Technical component performed at [facility], [address], [place], [place] [address].  Professional component performed at [facility] [address] [place], [place] [address]  Immunohistochemistry Technical component (if applicable) was performed at [facility]. [address], [place], [place] [address].  IMMUNOHISTOCHEMISTRY DISCLAIMER (if  applicable): Some of these immunohistochemical stains may have been developed and the performance characteristics determine by [facility]. Some may not have been cleared or approved by the U.S. Food and Drug Administration. The FDA has determined that such clearance or approval is not necessary. This test is used for clinical purposes. It  should not be regarded as investigational or for research. This laboratory is certified under the Clinical Laboratory Improvement Amendments of 1988 (CLIA-88) as qualified to perform high complexity clinical laboratory testing.  The controls stained appropriately.    EMB sound to 12 cm  Past Medical History:  Diagnosis Date   Abnormal Pap smear    Arthritis    in spine   GERD (gastroesophageal reflux disease)    Hypertension    STD (sexually transmitted disease)    HSV2   Thyroid  cyst    drained it   Past Surgical History:  Procedure Laterality Date   BIOPSY THYROID      TOOTH EXTRACTION  2022   TOOTH EXTRACTION     Social History   Socioeconomic History   Marital status: Married    Spouse name: Not on file   Number of children: Not on file   Years of education: Not on file   Highest education level: Not on file  Occupational History   Not on file  Tobacco Use   Smoking status: Never    Passive exposure: Never   Smokeless tobacco: Never  Vaping Use   Vaping status: Never Used  Substance and Sexual Activity   Alcohol use: Yes    Comment: Occassional   Drug use: No   Sexual activity: Not Currently    Partners: Male    Birth control/protection: Abstinence, Pill    Comment: menarche 53yo, sexual  debut 53yo  Other Topics Concern   Not on file  Social History Narrative   Not on file   Social Drivers of Health   Financial Resource Strain: Not on file  Food Insecurity: Not on file  Transportation Needs: Not on file  Physical Activity: Not on file  Stress: Not on file  Social Connections: Not on file  Intimate Partner Violence: Not on file   OB History     Gravida  1   Para  1   Term  1   Preterm      AB      Living  1      SAB      IAB      Ectopic      Multiple      Live Births  1          Current Outpatient Medications on File Prior to Visit  Medication Sig Dispense Refill   Cetirizine HCl (ZYRTEC ALLERGY) 10 MG CAPS as  needed.     hydrochlorothiazide (HYDRODIURIL) 25 MG tablet TK 1 T PO D FOR BP     Multiple Vitamin (MULTIVITAMIN PO) Take by mouth.     naproxen (EC NAPROSYN) 500 MG EC tablet Take 500 mg by mouth 2 (two) times daily with a meal.     norethindrone  (ORTHO MICRONOR ) 0.35 MG tablet Take 1 tablet (0.35 mg total) by mouth daily. 84 tablet 0   Omega-3 Fatty Acids (OMEGA 3 PO) Take by mouth.     Polyethyl Glycol-Propyl Glycol (SYSTANE) 0.4-0.3 % GEL ophthalmic gel Place 1 Application into both eyes.     valACYclovir  (VALTREX ) 500 MG tablet TAKE 1 TABLET BY MOUTH TWICE  DAILY WHEN OUTBREAK  OCCURS FOR 3 TO 5 DAYS 30 tablet 0   VITAMIN D  PO Take by mouth.     No current facility-administered medications on file prior to visit.   No Known Allergies  Family History  Problem Relation Age of Onset   Hypertension Mother    Diabetes Mother    Hypertension Maternal Grandmother    Breast cancer Neg Hx    Colon polyps Neg Hx    Colon cancer Neg Hx    Esophageal cancer Neg Hx    Liver cancer Neg Hx    Pancreatic cancer Neg Hx    Stomach cancer Neg Hx      Plan Menorrhagia, dysmenorrhea, enlarging fibroid uterus in perimenopausal period, endometrial polyp here today for preop for [facility]   The risks of surgery were discussed in detail with the patient including but not limited to: bleeding which may require transfusion or reoperation; infection which may require prolonged hospitalization or re-hospitalization and antibiotic therapy; injury to bowel, bladder, ureters and major vessels or other surrounding organs which may lead to other procedures; formation of adhesions; need for additional procedures including laparotomy or subsequent procedures secondary to intraoperative injury or abnormal pathology; thromboembolic phenomenon; incisional problems and other postoperative or anesthesia complications.  The postoperative expectations were also discussed in detail. The patient also understands the alternative  treatment options which were discussed in full. All questions were answered.  Patient would like to proceed with the procedure.  To begin estrace and explained how this will help with vaginal cuff healing and bladder support in the future. To begin now with twice daily use and then daily after surgery until instructed otherwise. Dr. Tia Flowers   20 minutes spent on reviewing records, imaging,  and one on one patient time and counseling patient and documentation Dr. Caro Christmas DNP, 9:33 AM 05/30/2023

## 2023-05-30 NOTE — Patient Instructions (Addendum)
 Robotic Laparoscopic Hysterectomy, Care After  The following information offers guidance on how to care for yourself after your procedure. Your health care provider may also give you more specific instructions. If you have problems or questions, contact your health care provider. What can I expect after the procedure? After the procedure, it is common to have: Pain, bruising, and numbness around your incisions. Tiredness (fatigue). Poor appetite. Less interest in sex. Vaginal discharge or bleeding. You will need to use a sanitary pad after this procedure.  HEAVY BLEEDING LIKE A PERIOD IS NOT NORMAL.  PLEASE CALL YOUR PROVIDER IF SOAKING A PAD. Feelings of sadness or other emotions. If your ovaries were also removed, it is also common to have symptoms of menopause, such as hot flashes, night sweats, and lack of sleep (insomnia).  Ovaries should stay in if at all possible until at least the age of 71. Follow these instructions at home: Medicines Take over-the-counter and prescription medicines only as told by your health care provider. Ask your health care provider if the medicine prescribed to you: Requires you to avoid driving or using machinery. Can cause constipation. You may need to take these actions to prevent or treat constipation: Drink enough fluid to keep your urine pale yellow. Take over-the-counter or prescription medicines. Eat foods that are high in fiber, such as beans, whole grains, and fresh fruits and vegetables. Limit foods that are high in fat and processed sugars, such as fried or sweet foods.  Also, avoid spicy foods.  NAUSEA IS COMMON THE FIRST NIGHT OF SURGERY.  IF IT LASTS BEYOND 24 HOURS, CALL YOUR PROVIDER.  NAUSEA MEDICATION WAS GIVEN AT YOUR PREOP APPOINTMENT THAT YOU CAN TAKE AFTER SURGERY. Incision care  Follow instructions from your health care provider about how to take care of your incisions. Make sure you: LEAVE INCISION OPEN AND DRY-NO BANDAGES Leave  stitches (sutures), skin glue, or adhesive strips in place UNTIL 2 WEEKS THEN REMOVE IN THE SHOWER.  If adhesive strip edges start to loosen and curl up, you may trim the loose edges. Check your incision areas every day for signs of infection. Check for: More redness, swelling, or pain. Fluid or blood. Warmth. Pus or a bad smell. Activity  Rest as told by your health care provider. Avoid sitting for a long time without moving. Get up to take short walks every 1-2 hours. This is important to improve blood flow and breathing. Ask for help if you feel weak or unsteady. Return to your normal activities as told by your health care provider. Ask your health care provider what activities are safe for you. Do not lift anything that is heavier than 10 lb (4.5 kg), or the limit that you are told, for 8 WEEKS after surgery or until your health care provider says that it is safe. If you were given a sedative during the procedure, it can affect you for several hours. Do not drive or operate machinery until your health care provider says that it is safe. Lifestyle Do not use any products that contain nicotine or tobacco. These products include cigarettes, chewing tobacco, and vaping devices, such as e-cigarettes. These can delay healing after surgery. If you need help quitting, ask your health care provider. Do not drink alcohol until your health care provider approves. General instructions FOR 2 WEEKS AFTER SURGERY, THEN YOU MAY USE TUBS AND HOT TUBS Do not douche, use tampons, or have sex for at least 10 weeks, or as told by your health care  provider. If you struggle with physical or emotional changes after your procedure, speak with your health care provider or a therapist. Do not take baths, swim, or use a hot tub until your health care provider approves. You may only be allowed to take showers for 2 weeks. IF YOU HAVE BURNING WITH URINATION, PLEASE CALL YOUR DOCTOR. BLADDER INFECTIONS MAY OCCUR AFTER  SURGERY Try to have someone at home with you for the first 1-2 weeks to help with your daily chores. Wear compression stockings as told by your health care provider. These stockings help to prevent blood clots and reduce swelling in your legs. Keep all follow-up visits. This is important. Contact a health care provider if: You have any of these signs of infection: Chills or a fever 131f OR GREATER. More redness, swelling, or pain around an incision. Fluid or blood coming from an incision. Warmth coming from an incision. Pus or a bad smell coming from an incision. Burning with urination. Urinary frequency or cramping.   IF YOU HAVE THESE SYMPTOMS, PLEASE CALL THE OFFICE TO COME EVALUATE FOR A BLADDER INFECTION AT [phone] An incision opens. You feel dizzy or light-headed. You have pain or bleeding when you urinate, or you are unable to urinate. You have abnormal vaginal discharge. You have pain that does not get better with medicine. Get help right away if: You have a fever and your symptoms suddenly get worse. You have severe abdominal pain. You have chest pain or shortness of breath. You may have chest pain and shortness of breath from the CO2 gas for a few days after surgery.  This is very common.  Walking, Gas-X and motrin  will usually help relieve this discomfort You faint. You have pain, swelling, or redness in your leg. You have heavy vaginal bleeding with blood clots, soaking through a sanitary pad in less than 1 hour. These symptoms may represent a serious problem that is an emergency. Do not wait to see if the symptoms will go away. Get medical help right away. Call your local emergency services (911 in the [place] Do not drive yourself to the hospital. Summary  CONSTIPATION MEDICATION AFTER SURGERY: COLACE, MOM, MIRALAX, GAS X are all helpful to have on hand, if needed.  FILL ALL POSTOP MEDICATION BEFORE SURGERY  After the procedure, it is common to have pain and bruising  around your incisions. Do not take baths, swim, or use a hot tub until your health care provider approves. Do not lift anything that is heavier than 13 lb (4.5 kg), or the limit that you are told, for one month after surgery or until your health care provider says that it is safe. Tell your health care provider if you have any signs or symptoms of infection after the procedure. Get help right away if you have severe abdominal pain, chest pain, shortness of breath, or heavy bleeding from your vagina. This information is not intended to replace advice given to you by your  health care provider. Make sure you discuss any questions you have with your health care provider. Document Revised: 09/04/2019 Document Reviewed: 09/05/2019 Elsevier Patient Education  2024 [employer]  Vaginal estrogen apply dime size amount twice daily in vagina then daily until told to stop for vaginal cuff healing.

## 2023-06-04 ENCOUNTER — Encounter: Payer: Self-pay | Admitting: *Deleted

## 2023-06-08 ENCOUNTER — Encounter: Admitting: Obstetrics and Gynecology

## 2023-06-15 ENCOUNTER — Encounter (HOSPITAL_COMMUNITY): Payer: Self-pay | Admitting: Obstetrics and Gynecology

## 2023-06-15 DIAGNOSIS — Z0289 Encounter for other administrative examinations: Secondary | ICD-10-CM

## 2023-06-15 NOTE — Progress Notes (Signed)
 Spoke w/ via phone for pre-op interview--- pt Lab needs dos----    urine preg     Lab results------  lab appt 06-19-2023 @ 1300 getting CBC/ BMP/ T&S/ EKG COVID test -----patient states asymptomatic no test needed Arrive at ------- 0700 on 06-22-2023 NPO after MN w/ exception sips of water w/ meds Pre-Surgery Ensure or G2:  n/a  Med rec completed Medications to take morning of surgery ----- eye drops  Diabetic medication ----- n/a  GLP1 agonist last dose: n/a GLP1 instructions:  Patient instructed no nail polish to be worn day of surgery Patient instructed to bring photo id and insurance card day of surgery Patient aware to have Driver (ride ) / caregiver    for 24 hours after surgery - daughter, [name] Patient Special Instructions ----- will pick up bag w/ soap and written instructions at lab appt Pre-Op special Instructions -----  n/a  Patient verbalized understanding of instructions that were given at this phone interview. Patient denies chest pain, sob, fever, cough at the interview.

## 2023-06-15 NOTE — Pre-Procedure Instructions (Addendum)
 Surgical Instructions   Your procedure is scheduled on :  Friday,  06-22-2023. Report to [facility] Main Entrance "A" at 7:00  A.M., then check in with the Admitting office. Any questions or running late day of surgery: call [phone]  Questions prior to your surgery date: call [phone], Monday-Friday, 8am-4pm. If you experience any cold or flu symptoms such as cough, fever, chills, shortness of breath, etc. between now and your scheduled surgery, please notify your surgeon office.     Remember:  Do not eat any food and do not drink liquids after midnight the night before your surgery.  This includes no water,  candy,  gum,  and  mints.     Take these medicines the morning of surgery with A SIP OF WATER : Systane eye drops   May take these medicines IF NEEDED: sips of water Cetirizine (zyrtec) Saline nasal spray    One week prior to surgery, STOP taking any Aspirin (unless otherwise instructed by your surgeon) Aleve, Naproxen, Ibuprofen , Motrin , Advil , Goody's, BC's, all herbal medications, fish oil, and non-prescription vitamins.                     Do NOT Smoke (Tobacco/Vaping) and Do Not drink alcohol for 24 hours prior to your procedure.  If you use a CPAP at night, you may bring your mask/headgear for your overnight stay.   You will be asked to remove any contacts, glasses, piercing's, hearing aid's, dentures/partials prior to surgery. Please bring cases for these items if needed.    Patients discharged the day of surgery will not be allowed to drive home, and someone needs to stay with them for 24 hours.  SURGICAL WAITING ROOM VISITATION Patients may have no more than 2 support people in the waiting area - these visitors may rotate.   Pre-op nurse will coordinate an appropriate time for 1 ADULT support person, who may not rotate, to accompany patient in pre-op.  Children under the age of 34 must have an adult with them who is not the patient and must remain in the  main waiting area with an adult.  If the patient needs to stay at the hospital during part of their recovery, the visitor guidelines for inpatient rooms apply.  Please refer to the [facility] website for the visitor guidelines for any additional information.   If you received a COVID test during your pre-op visit  it is requested that you wear a mask when out in public, stay away from anyone that may not be feeling well and notify your surgeon if you develop symptoms. If you have been in contact with anyone that has tested positive in the last 10 days please notify you surgeon.      Pre-operative CHG Bathing Instructions   You can play a key role in reducing the risk of infection after surgery. Your skin needs to be as free of germs as possible. You can reduce the number of germs on your skin by washing with CHG (chlorhexidine gluconate) soap before surgery. CHG is an antiseptic soap that kills germs and continues to kill germs even after washing.   DO NOT use if you have an allergy to chlorhexidine/CHG or antibacterial soaps. If your skin becomes reddened or irritated, stop using the CHG and notify Pre-op nurse day of surgery.  ~Please get dial soap or other antibacterial soap (no scent) and shower following the instructions below.  TAKE A SHOWER THE NIGHT BEFORE SURGERY AND THE DAY OF SURGERY    Please keep in mind the following:  DO NOT shave, including legs and underarms, 48 hours prior to surgery.   You may shave your face before/day of surgery.  Place clean sheets on your bed the night before surgery Use a clean washcloth (not used since being washed) for each shower. DO NOT sleep with pet's night before surgery.  CHG Shower Instructions:  Wash your face and private area with normal soap. If you choose to wash your hair, wash first with your normal shampoo.  After you use shampoo/soap, rinse your hair and body thoroughly to remove shampoo/soap residue.  Turn the  water OFF and apply half the bottle of CHG soap to a CLEAN washcloth.  Apply CHG soap ONLY FROM YOUR NECK DOWN TO YOUR TOES (washing for 3-5 minutes)  DO NOT use CHG soap on face, private areas, open wounds, or sores.  Pay special attention to the area where your surgery is being performed.  If you are having back surgery, having someone wash your back for you may be helpful. Wait 2 minutes after CHG soap is applied, then you may rinse off the CHG soap.  Pat dry with a clean towel  Put on clean pajamas    Additional instructions for the day of surgery: DO NOT APPLY any lotions,  powder,  oils,  deodorants (may use underarm deodorant) , cologne/  perfumes  or makeup Do not wear jewelry /  piercing's/  metal/  permanent jewelry must be removed prior to arrival day of surgery.  (No plastic piercing) Do not wear nail polish, gel polish, artificial nails, or any other type of covering on natural finger nails (toe nails are okay) Do not bring valuables to the hospital. [facility] is not responsible for valuables/personal belongings. Put on clean/comfortable clothes.  Please brush your teeth.  Ask your nurse before applying any prescription medications to the skin.

## 2023-06-16 IMAGING — US US ABDOMEN LIMITED
1 series · 14 of 25 positions shown · non-contrast
Comparison: None.

CLINICAL DATA: Right upper quadrant pain.

EXAM:
ULTRASOUND ABDOMEN LIMITED RIGHT UPPER QUADRANT

[Series 1: us abdomen limited · 0.22mm/px · 14 of 29 slices shown]
[im 1/29]
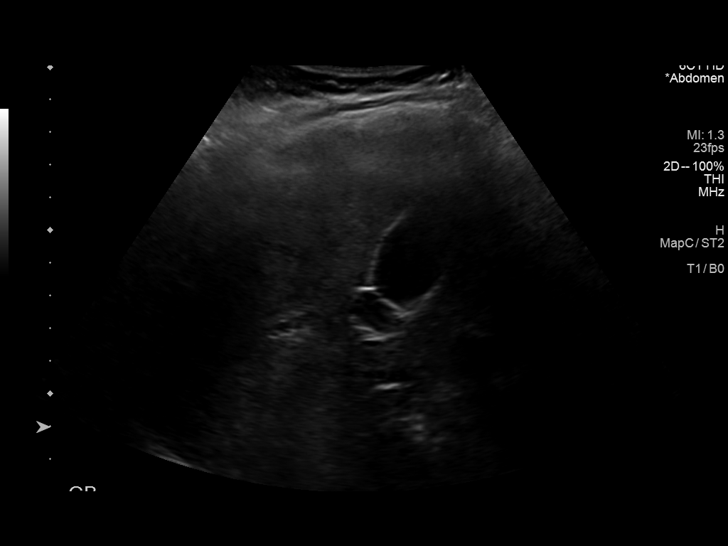
[im 3/29]
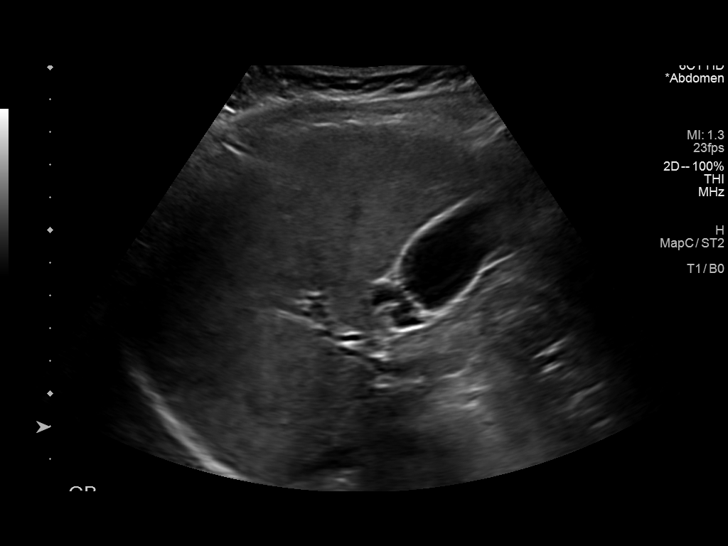
[im 5/29]
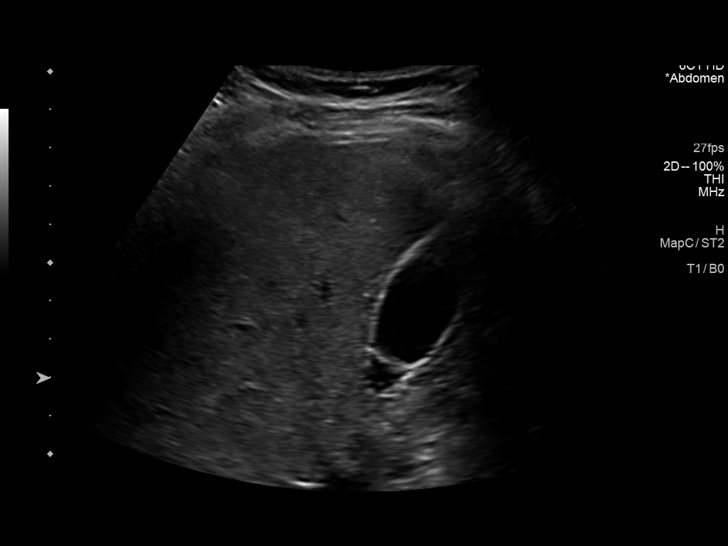
[im 8/29]
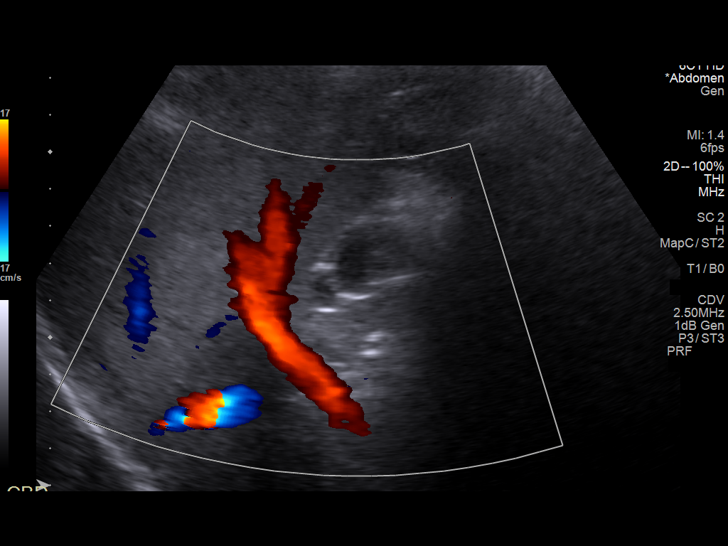
[im 10/29]
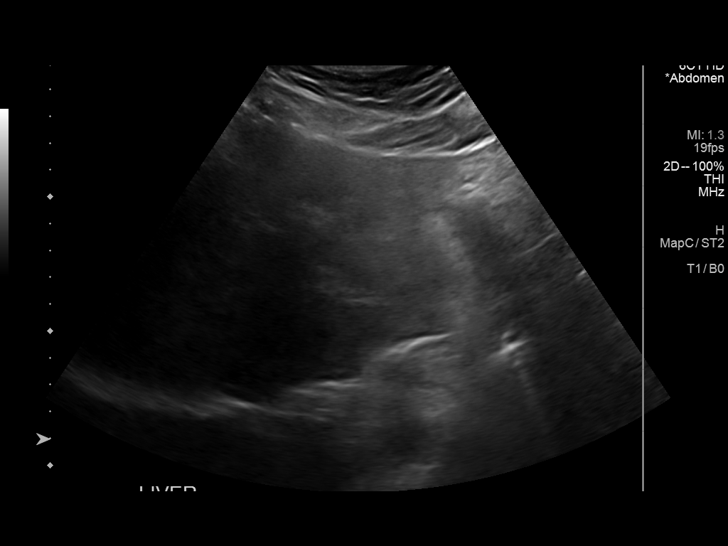
[im 11/29]
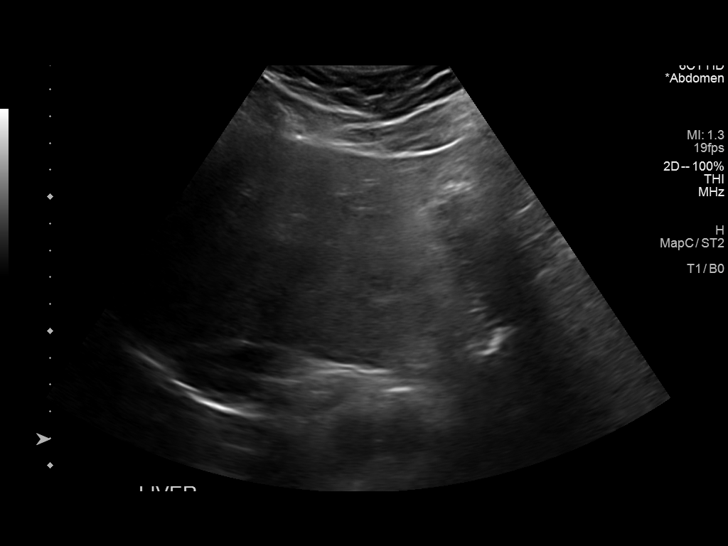
[im 13/29]
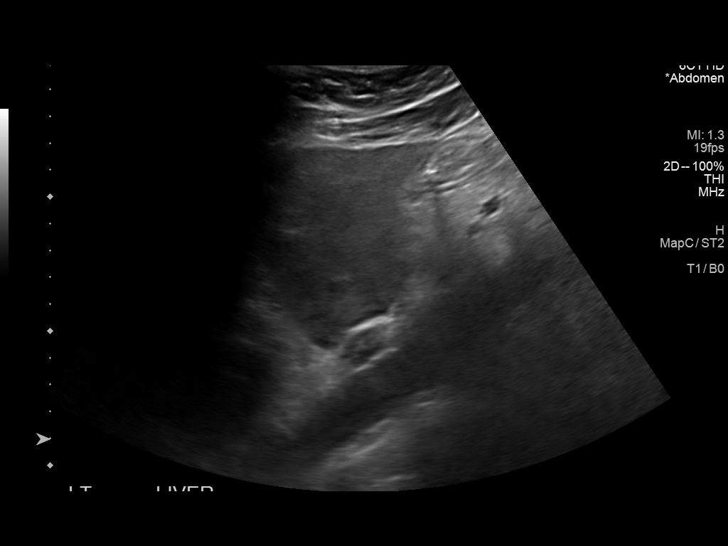
[im 16/29]
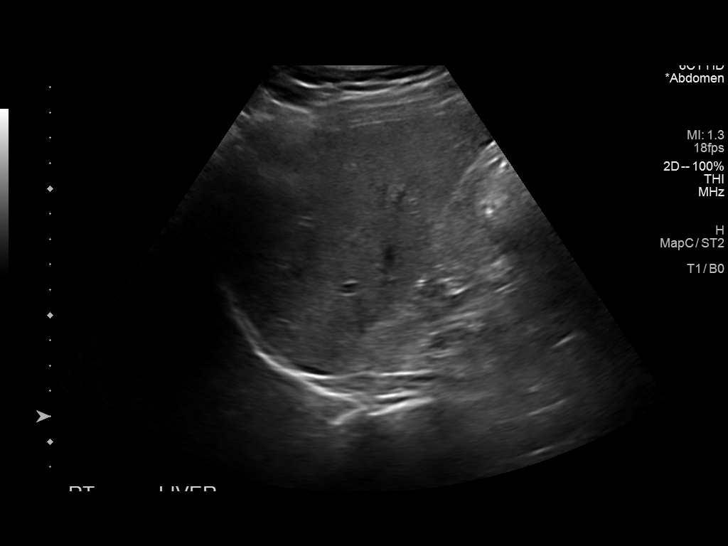
[im 18/29]
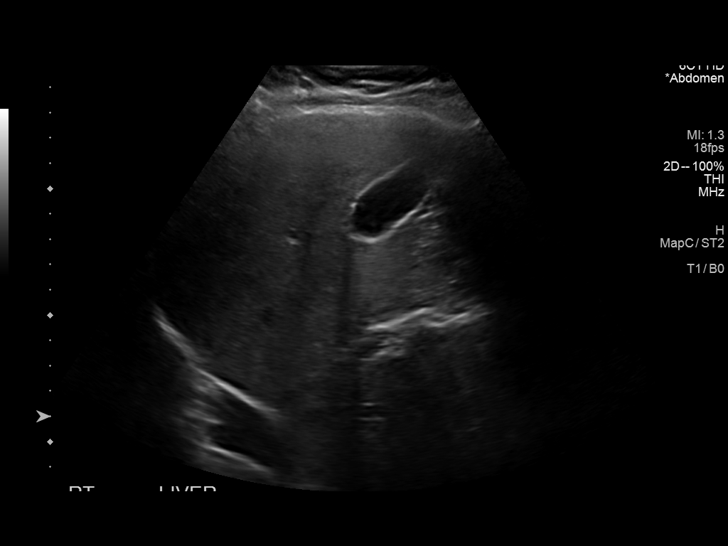
[im 19/29]
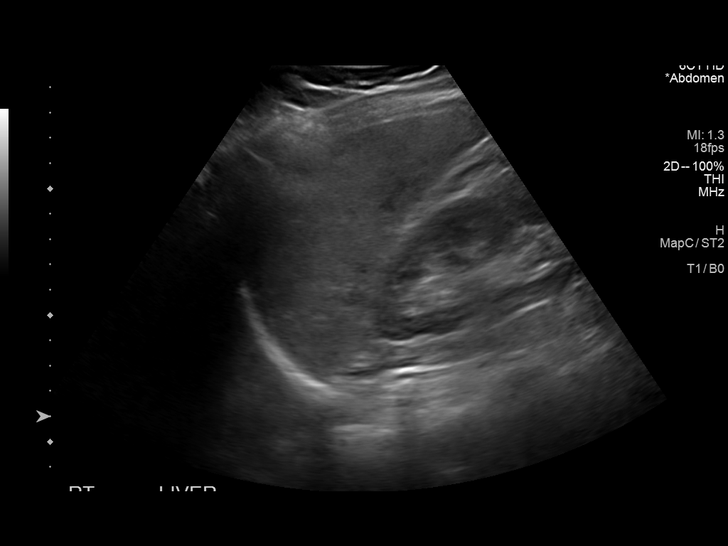
[im 22/29]
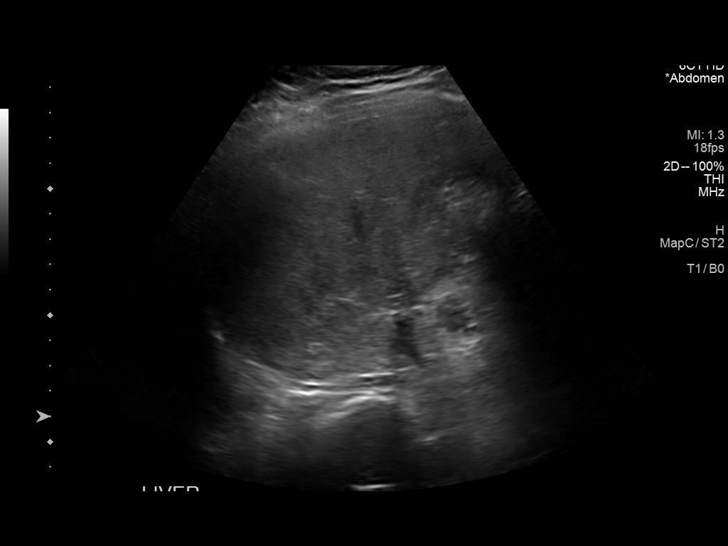
[im 24/29]
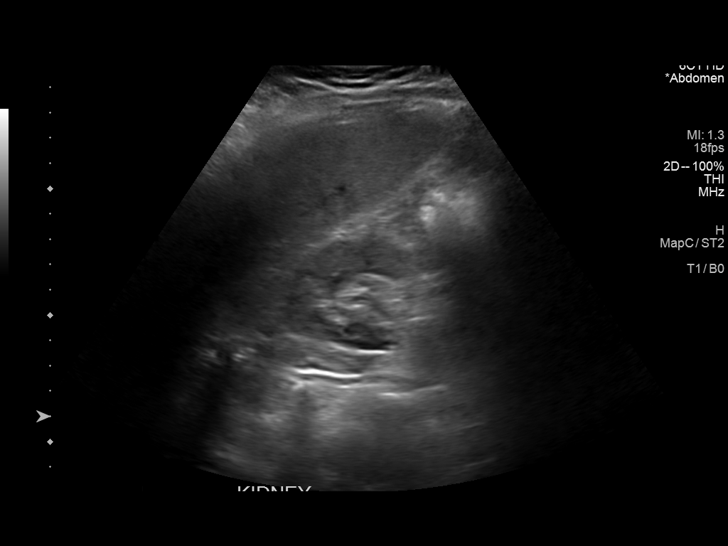
[im 26/29]
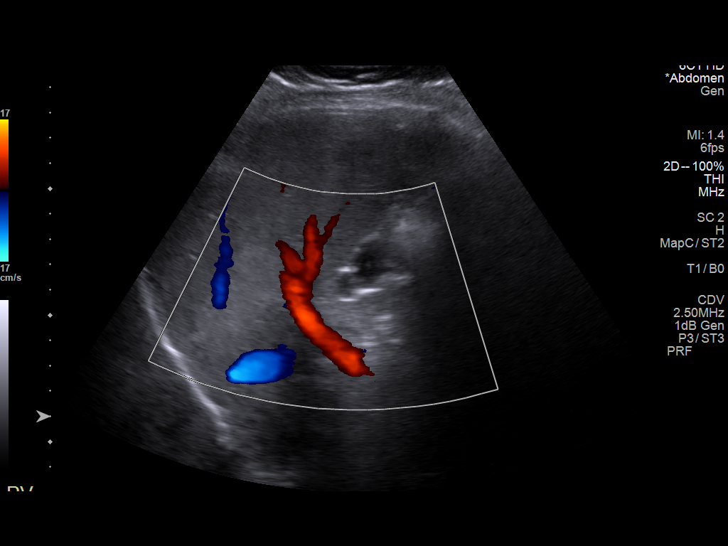
[im 29/29]
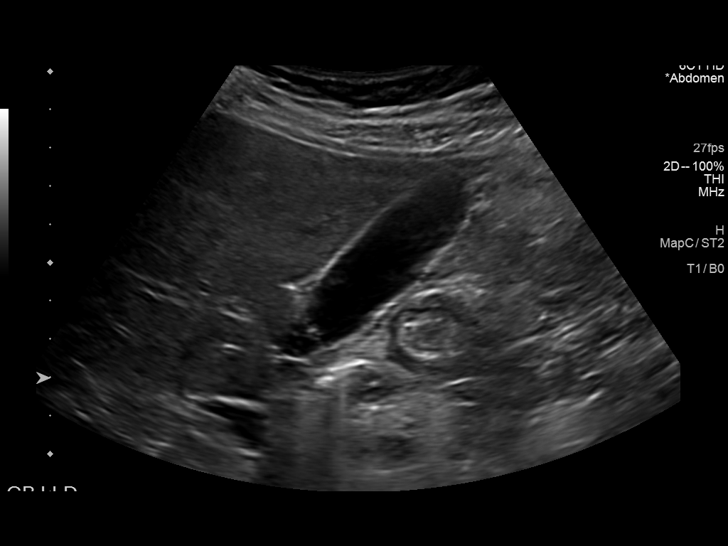

[14 of 25 positions shown; findings below may reference images not displayed]

FINDINGS: Gallbladder:

No gallstones or wall thickening visualized. No sonographic Murphy
sign noted by sonographer.

Common bile duct:

Diameter: 3.3 mm.

Liver:

Mild increased parenchymal echogenicity compatible with a degree of
steatosis. No focal mass. Portal vein is patent on color Doppler
imaging with normal direction of blood flow towards the liver.

Other: None.
IMPRESSION: 1.  No acute hepatobiliary findings.

2.  Suggestion of a mild degree of hepatic steatosis.

## 2023-06-19 ENCOUNTER — Ambulatory Visit: Payer: Self-pay | Admitting: Obstetrics and Gynecology

## 2023-06-19 ENCOUNTER — Encounter: Payer: Self-pay | Admitting: Obstetrics and Gynecology

## 2023-06-19 ENCOUNTER — Encounter (HOSPITAL_COMMUNITY)
Admission: RE | Admit: 2023-06-19 | Discharge: 2023-06-19 | Disposition: A | Discharge status: Home / Self Care | Source: Ambulatory Visit | Attending: Obstetrics and Gynecology | Admitting: Obstetrics and Gynecology

## 2023-06-19 DIAGNOSIS — Z01812 Encounter for preprocedural laboratory examination: Secondary | ICD-10-CM | POA: Diagnosis present

## 2023-06-19 DIAGNOSIS — Z01818 Encounter for other preprocedural examination: Secondary | ICD-10-CM

## 2023-06-19 LAB — BASIC METABOLIC PANEL WITH GFR
Anion gap: 6 (ref 5–15)
BUN: 13 mg/dL (ref 6–20)
CO2: 26 mmol/L (ref 22–32)
Calcium: 9.4 mg/dL (ref 8.9–10.3)
Chloride: 107 mmol/L (ref 98–111)
Creatinine, Ser: 0.98 mg/dL (ref 0.44–1.00)
GFR, Estimated: >60 mL/min (ref >60)
Glucose, Bld: 97 mg/dL (ref 70–99)
Potassium: 4.2 mmol/L (ref 3.5–5.1)
Sodium: 139 mmol/L (ref 135–145)

## 2023-06-19 LAB — TYPE AND SCREEN
ABO/RH(D): A POS
Antibody Screen: NEGATIVE

## 2023-06-19 LAB — CBC
HCT: 39.8 % (ref 36.0–46.0)
Hemoglobin: 12.3 g/dL (ref 12.0–15.0)
MCH: 25 pg — ABNORMAL LOW (ref 26.0–34.0)
MCHC: 30.9 g/dL (ref 30.0–36.0)
MCV: 80.9 fL (ref 80.0–100.0)
Platelets: 245 10*3/uL (ref 150–400)
RBC: 4.92 MIL/uL (ref 3.87–5.11)
RDW: 14.7 % (ref 11.5–15.5)
WBC: 5 10*3/uL (ref 4.0–10.5)
nRBC: 0 % (ref 0.0–0.2)

## 2023-06-21 MED ORDER — SODIUM CHLORIDE 0.9 % IV SOLN
INTRAVENOUS | Status: AC
  Administered 2023-06-22: 2 mL
  Filled 2023-06-21: qty 10

## 2023-06-22 ENCOUNTER — Ambulatory Visit (HOSPITAL_COMMUNITY)
Admission: RE | Admit: 2023-06-22 | Discharge: 2023-06-22 | Disposition: A | Discharge status: Home / Self Care | Attending: Obstetrics and Gynecology | Admitting: Obstetrics and Gynecology

## 2023-06-22 ENCOUNTER — Other Ambulatory Visit: Payer: Self-pay

## 2023-06-22 ENCOUNTER — Encounter (HOSPITAL_COMMUNITY)
Admission: RE | Disposition: A | Discharge status: Home / Self Care | Payer: Self-pay | Source: Home / Self Care | Attending: Obstetrics and Gynecology

## 2023-06-22 ENCOUNTER — Encounter (HOSPITAL_COMMUNITY): Payer: Self-pay | Admitting: Obstetrics and Gynecology

## 2023-06-22 ENCOUNTER — Ambulatory Visit (HOSPITAL_COMMUNITY): Admitting: Anesthesiology

## 2023-06-22 DIAGNOSIS — D251 Intramural leiomyoma of uterus: Secondary | ICD-10-CM | POA: Diagnosis not present

## 2023-06-22 DIAGNOSIS — N946 Dysmenorrhea, unspecified: Secondary | ICD-10-CM | POA: Insufficient documentation

## 2023-06-22 DIAGNOSIS — N840 Polyp of corpus uteri: Secondary | ICD-10-CM

## 2023-06-22 DIAGNOSIS — Z01818 Encounter for other preprocedural examination: Secondary | ICD-10-CM

## 2023-06-22 DIAGNOSIS — D252 Subserosal leiomyoma of uterus: Secondary | ICD-10-CM | POA: Diagnosis not present

## 2023-06-22 DIAGNOSIS — E669 Obesity, unspecified: Secondary | ICD-10-CM | POA: Diagnosis not present

## 2023-06-22 DIAGNOSIS — D259 Leiomyoma of uterus, unspecified: Secondary | ICD-10-CM

## 2023-06-22 DIAGNOSIS — I10 Essential (primary) hypertension: Secondary | ICD-10-CM | POA: Insufficient documentation

## 2023-06-22 DIAGNOSIS — D649 Anemia, unspecified: Secondary | ICD-10-CM | POA: Diagnosis not present

## 2023-06-22 DIAGNOSIS — D219 Benign neoplasm of connective and other soft tissue, unspecified: Secondary | ICD-10-CM | POA: Diagnosis not present

## 2023-06-22 DIAGNOSIS — Z6831 Body mass index (BMI) 31.0-31.9, adult: Secondary | ICD-10-CM

## 2023-06-22 DIAGNOSIS — N920 Excessive and frequent menstruation with regular cycle: Secondary | ICD-10-CM | POA: Diagnosis present

## 2023-06-22 HISTORY — DX: Presence of spectacles and contact lenses: Z97.3

## 2023-06-22 HISTORY — PX: HYSTERECTOMY, TOTAL, LAPAROSCOPIC, ROBOT-ASSISTED WITH SALPINGECTOMY: SHX7587

## 2023-06-22 HISTORY — DX: Other intervertebral disc degeneration, lumbar region without mention of lumbar back pain or lower extremity pain: M51.369

## 2023-06-22 HISTORY — DX: Personal history of other diseases of the female genital tract: Z87.42

## 2023-06-22 HISTORY — DX: Excessive and frequent menstruation with regular cycle: N92.0

## 2023-06-22 HISTORY — PX: CYSTOSCOPY: SHX5120

## 2023-06-22 HISTORY — DX: Dysmenorrhea, unspecified: N94.6

## 2023-06-22 HISTORY — DX: Anemia, unspecified: D64.9

## 2023-06-22 HISTORY — DX: Personal history of other infectious and parasitic diseases: Z86.19

## 2023-06-22 HISTORY — DX: Leiomyoma of uterus, unspecified: D25.9

## 2023-06-22 LAB — ABO/RH: ABO/RH(D): A POS

## 2023-06-22 LAB — POCT PREGNANCY, URINE: Preg Test, Ur: NEGATIVE

## 2023-06-22 SURGERY — HYSTERECTOMY, TOTAL, LAPAROSCOPIC, ROBOT-ASSISTED WITH SALPINGECTOMY
Anesthesia: General

## 2023-06-22 MED ORDER — KETAMINE HCL 50 MG/5ML IJ SOSY
PREFILLED_SYRINGE | INTRAMUSCULAR | Status: AC
  Filled 2023-06-22: qty 5

## 2023-06-22 MED ORDER — POVIDONE-IODINE 10 % EX SWAB
2.0000 | Freq: Once | CUTANEOUS | Status: AC
  Administered 2023-06-22: 2 via TOPICAL

## 2023-06-22 MED ORDER — KETAMINE HCL 10 MG/ML IJ SOLN
INTRAMUSCULAR | Status: DC | PRN
  Administered 2023-06-22: 30 mg via INTRAVENOUS

## 2023-06-22 MED ORDER — BUPIVACAINE HCL (PF) 0.5 % IJ SOLN
INTRAMUSCULAR | Status: DC | PRN
  Administered 2023-06-22: 14 mL

## 2023-06-22 MED ORDER — AMISULPRIDE (ANTIEMETIC) 5 MG/2ML IV SOLN: 10.0000 mg | Freq: Once | INTRAVENOUS | Status: DC | PRN

## 2023-06-22 MED ORDER — SCOPOLAMINE 1 MG/3DAYS TD PT72
1.0000 | MEDICATED_PATCH | TRANSDERMAL | Status: DC
  Administered 2023-06-22: 1.5 mg via TRANSDERMAL

## 2023-06-22 MED ORDER — MEPERIDINE HCL 25 MG/ML IJ SOLN: 6.2500 mg | INTRAMUSCULAR | Status: DC | PRN

## 2023-06-22 MED ORDER — HYDROMORPHONE HCL 1 MG/ML IJ SOLN
INTRAMUSCULAR | Status: AC
  Filled 2023-06-22: qty 1

## 2023-06-22 MED ORDER — OXYCODONE HCL 5 MG PO TABS: 5.0000 mg | ORAL_TABLET | Freq: Once | ORAL | Status: DC | PRN

## 2023-06-22 MED ORDER — KETOROLAC TROMETHAMINE 30 MG/ML IJ SOLN
INTRAMUSCULAR | Status: DC | PRN
  Administered 2023-06-22: 30 mg via INTRAVENOUS

## 2023-06-22 MED ORDER — LACTATED RINGERS IV SOLN: INTRAVENOUS | Status: DC

## 2023-06-22 MED ORDER — KETOROLAC TROMETHAMINE 30 MG/ML IJ SOLN
30.0000 mg | Freq: Once | INTRAMUSCULAR | Status: DC | PRN
Start: 2023-06-22 — End: 2023-06-22

## 2023-06-22 MED ORDER — SODIUM CHLORIDE 0.9 % IV SOLN
INTRAVENOUS | Status: DC | PRN
  Administered 2023-06-22: 1000 mL

## 2023-06-22 MED ORDER — SCOPOLAMINE 1 MG/3DAYS TD PT72
MEDICATED_PATCH | TRANSDERMAL | Status: AC
  Filled 2023-06-22: qty 1

## 2023-06-22 MED ORDER — CHLORHEXIDINE GLUCONATE 0.12 % MT SOLN
15.0000 mL | Freq: Once | OROMUCOSAL | Status: AC
  Administered 2023-06-22: 15 mL via OROMUCOSAL

## 2023-06-22 MED ORDER — MIDAZOLAM HCL 2 MG/2ML IJ SOLN
INTRAMUSCULAR | Status: AC
  Filled 2023-06-22: qty 2

## 2023-06-22 MED ORDER — OXYCODONE HCL 5 MG/5ML PO SOLN: 5.0000 mg | Freq: Once | ORAL | Status: DC | PRN

## 2023-06-22 MED ORDER — PROPOFOL 10 MG/ML IV BOLUS
INTRAVENOUS | Status: DC | PRN
  Administered 2023-06-22: 200 mg via INTRAVENOUS

## 2023-06-22 MED ORDER — CHLORHEXIDINE GLUCONATE 0.12 % MT SOLN
OROMUCOSAL | Status: AC
  Filled 2023-06-22: qty 15

## 2023-06-22 MED ORDER — BUPIVACAINE HCL (PF) 0.5 % IJ SOLN
INTRAMUSCULAR | Status: AC
  Filled 2023-06-22: qty 30

## 2023-06-22 MED ORDER — PROPOFOL 10 MG/ML IV BOLUS
INTRAVENOUS | Status: AC
  Filled 2023-06-22: qty 20

## 2023-06-22 MED ORDER — PHENYLEPHRINE HCL-NACL 20-0.9 MG/250ML-% IV SOLN: INTRAVENOUS | Status: DC | PRN

## 2023-06-22 MED ORDER — SUGAMMADEX SODIUM 200 MG/2ML IV SOLN
INTRAVENOUS | Status: DC | PRN
  Administered 2023-06-22: 200 mg via INTRAVENOUS
  Administered 2023-06-22: 100 mg via INTRAVENOUS

## 2023-06-22 MED ORDER — DEXAMETHASONE SODIUM PHOSPHATE 10 MG/ML IJ SOLN
INTRAMUSCULAR | Status: DC | PRN
  Administered 2023-06-22: 10 mg via INTRAVENOUS

## 2023-06-22 MED ORDER — FENTANYL CITRATE (PF) 250 MCG/5ML IJ SOLN
INTRAMUSCULAR | Status: DC | PRN
  Administered 2023-06-22: 100 ug via INTRAVENOUS

## 2023-06-22 MED ORDER — LACTATED RINGERS IV SOLN: INTRAVENOUS | Status: DC | PRN

## 2023-06-22 MED ORDER — 0.9 % SODIUM CHLORIDE (POUR BTL) OPTIME
TOPICAL | Status: DC | PRN
  Administered 2023-06-22: 1000 mL

## 2023-06-22 MED ORDER — ACETAMINOPHEN 500 MG PO TABS
ORAL_TABLET | ORAL | Status: AC
  Filled 2023-06-22: qty 2

## 2023-06-22 MED ORDER — PROPOFOL 1000 MG/100ML IV EMUL
INTRAVENOUS | Status: AC
  Filled 2023-06-22: qty 200

## 2023-06-22 MED ORDER — PHENYLEPHRINE HCL-NACL 20-0.9 MG/250ML-% IV SOLN
INTRAVENOUS | Status: DC | PRN
  Administered 2023-06-22: 40 ug via INTRAVENOUS

## 2023-06-22 MED ORDER — ONDANSETRON HCL 4 MG/2ML IJ SOLN
4.0000 mg | Freq: Once | INTRAMUSCULAR | Status: DC | PRN
Start: 2023-06-22 — End: 2023-06-22

## 2023-06-22 MED ORDER — ORAL CARE MOUTH RINSE: 15.0000 mL | Freq: Once | OROMUCOSAL | Status: AC

## 2023-06-22 MED ORDER — METRONIDAZOLE 500 MG/100ML IV SOLN
500.0000 mg | Freq: Once | INTRAVENOUS | Status: AC
  Administered 2023-06-22: 500 mg via INTRAVENOUS

## 2023-06-22 MED ORDER — HYDROMORPHONE HCL 1 MG/ML IJ SOLN
0.2500 mg | INTRAMUSCULAR | Status: DC | PRN
  Administered 2023-06-22 (×2): 0.5 mg via INTRAVENOUS

## 2023-06-22 MED ORDER — ROCURONIUM BROMIDE 10 MG/ML (PF) SYRINGE
PREFILLED_SYRINGE | INTRAVENOUS | Status: DC | PRN
  Administered 2023-06-22: 100 mg via INTRAVENOUS

## 2023-06-22 MED ORDER — FENTANYL CITRATE (PF) 250 MCG/5ML IJ SOLN
INTRAMUSCULAR | Status: AC
  Filled 2023-06-22: qty 5

## 2023-06-22 MED ORDER — SODIUM CHLORIDE 0.9 % IV SOLN
2.0000 g | INTRAVENOUS | Status: AC
  Administered 2023-06-22: 2 g via INTRAVENOUS

## 2023-06-22 MED ORDER — MIDAZOLAM HCL 2 MG/2ML IJ SOLN
INTRAMUSCULAR | Status: DC | PRN
  Administered 2023-06-22: 2 mg via INTRAVENOUS

## 2023-06-22 MED ORDER — LIDOCAINE 2% (20 MG/ML) 5 ML SYRINGE
INTRAMUSCULAR | Status: DC | PRN
  Administered 2023-06-22: 80 mg via INTRAVENOUS

## 2023-06-22 MED ORDER — KETOROLAC TROMETHAMINE 30 MG/ML IJ SOLN
INTRAMUSCULAR | Status: AC
  Filled 2023-06-22: qty 1

## 2023-06-22 MED ORDER — ACETAMINOPHEN 500 MG PO TABS
1000.0000 mg | ORAL_TABLET | ORAL | Status: AC
  Administered 2023-06-22: 1000 mg via ORAL

## 2023-06-22 MED ORDER — ONDANSETRON HCL 4 MG/2ML IJ SOLN
INTRAMUSCULAR | Status: DC | PRN
  Administered 2023-06-22: 4 mg via INTRAVENOUS

## 2023-06-22 MED ORDER — SODIUM CHLORIDE 0.9 % IV SOLN
INTRAVENOUS | Status: AC
  Filled 2023-06-22: qty 2

## 2023-06-22 MED ORDER — METRONIDAZOLE 500 MG/100ML IV SOLN
INTRAVENOUS | Status: AC
  Filled 2023-06-22: qty 100

## 2023-06-22 SURGICAL SUPPLY — 53 items
APPLICATOR ARISTA FLEXITIP XL (MISCELLANEOUS) IMPLANT
BARRIER ADHS 3X4 INTERCEED (GAUZE/BANDAGES/DRESSINGS) IMPLANT
COVER BACK TABLE 60X90IN (DRAPES) ×2 IMPLANT
COVER MAYO STAND STRL (DRAPES) ×2 IMPLANT
COVER TIP SHEARS 8 DVNC (MISCELLANEOUS) ×2 IMPLANT
DEFOGGER SCOPE WARM SEASHARP (MISCELLANEOUS) ×2 IMPLANT
DERMABOND ADVANCED .7 DNX12 (GAUZE/BANDAGES/DRESSINGS) ×2 IMPLANT
DRAPE ARM DVNC X/XI (DISPOSABLE) ×8 IMPLANT
DRAPE COLUMN DVNC XI (DISPOSABLE) ×2 IMPLANT
DRAPE SURG IRRIG POUCH 19X23 (DRAPES) ×2 IMPLANT
DRAPE UTILITY XL STRL (DRAPES) ×2 IMPLANT
DRIVER NDL MEGA SUTCUT DVNCXI (INSTRUMENTS) ×2 IMPLANT
DRIVER NDLE MEGA SUTCUT DVNCXI (INSTRUMENTS) ×2 IMPLANT
DURAPREP 26ML APPLICATOR (WOUND CARE) ×2 IMPLANT
ELECTRODE REM PT RTRN 9FT ADLT (ELECTROSURGICAL) ×2 IMPLANT
FORCEPS PROGRASP DVNC XI (FORCEP) ×2 IMPLANT
GAUZE 4X4 16PLY ~~LOC~~+RFID DBL (SPONGE) IMPLANT
GLOVE BIOGEL PI IND STRL 7.0 (GLOVE) ×4 IMPLANT
GLOVE NEODERM STER SZ 7 (GLOVE) ×6 IMPLANT
GOWN STRL REUS W/TWL LRG LVL3 (GOWN DISPOSABLE) ×2 IMPLANT
HEMOSTAT ARISTA ABSORB 3G PWDR (HEMOSTASIS) IMPLANT
HOLDER FOLEY CATH W/STRAP (MISCELLANEOUS) IMPLANT
IRRIGATION SUCT STRKRFLW 2 WTP (MISCELLANEOUS) ×2 IMPLANT
KIT PINK PAD W/HEAD ARE REST (MISCELLANEOUS) ×2 IMPLANT
KIT PINK PAD W/HEAD ARM REST (MISCELLANEOUS) ×2 IMPLANT
KIT TURNOVER KIT B (KITS) ×2 IMPLANT
LEGGING LITHOTOMY PAIR STRL (DRAPES) ×2 IMPLANT
MANIFOLD NEPTUNE II (INSTRUMENTS) ×2 IMPLANT
NS IRRIG 1000ML POUR BTL (IV SOLUTION) ×2 IMPLANT
OBTURATOR OPTICALSTD 8 DVNC (TROCAR) ×2 IMPLANT
OCCLUDER COLPOPNEUMO (BALLOONS) IMPLANT
PACK CYSTO (CUSTOM PROCEDURE TRAY) ×2 IMPLANT
PACK ROBOT WH (CUSTOM PROCEDURE TRAY) ×2 IMPLANT
PACK ROBOTIC GOWN (GOWN DISPOSABLE) ×2 IMPLANT
PAD OB MATERNITY 11 LF (PERSONAL CARE ITEMS) ×2 IMPLANT
RUMI II 3.0CM BLUE KOH-EFFICIE (DISPOSABLE) IMPLANT
RUMI II GYRUS 2.5CM BLUE (DISPOSABLE) IMPLANT
RUMI II GYRUS 3.5CM BLUE (DISPOSABLE) IMPLANT
RUMI II GYRUS 4.0CM BLUE (DISPOSABLE) IMPLANT
SCISSORS MNPLR CVD DVNC XI (INSTRUMENTS) ×2 IMPLANT
SEAL UNIV 5-12 XI (MISCELLANEOUS) ×6 IMPLANT
SEALER VESSEL EXT DVNC XI (MISCELLANEOUS) IMPLANT
SET CYSTO W/LG BORE CLAMP LF (SET/KITS/TRAYS/PACK) ×2 IMPLANT
SET TUBE SMOKE EVAC HIGH FLOW (TUBING) ×2 IMPLANT
SLEEVE SCD COMPRESS KNEE MED (STOCKING) ×2 IMPLANT
SPIKE FLUID TRANSFER (MISCELLANEOUS) ×2 IMPLANT
SUT MNCRL AB 4-0 PS2 18 (SUTURE) ×2 IMPLANT
SUT VLOC 180 0 9IN GS21 (SUTURE) ×4 IMPLANT
TIP UTERINE 6.7X10CM GRN DISP (MISCELLANEOUS) IMPLANT
TIP UTERINE 6.7X8CM BLUE DISP (MISCELLANEOUS) IMPLANT
TOWEL GREEN STERILE (TOWEL DISPOSABLE) ×2 IMPLANT
TRAY FOLEY W/BAG SLVR 14FR (SET/KITS/TRAYS/PACK) ×2 IMPLANT
UNDERPAD 30X36 HEAVY ABSORB (UNDERPADS AND DIAPERS) ×2 IMPLANT

## 2023-06-22 NOTE — Anesthesia Preprocedure Evaluation (Addendum)
 Anesthesia Evaluation  Patient identified by MRN, date of birth, ID band Patient awake    Reviewed: Allergy & Precautions, NPO status , Patient's Chart, lab work & pertinent test results  Airway Mallampati: III  TM Distance: >3 FB Neck ROM: Full    Dental  (+) Teeth Intact, Dental Advisory Given   Pulmonary neg pulmonary ROS   Pulmonary exam normal breath sounds clear to auscultation       Cardiovascular hypertension (133/87 preop), Pt. on medications Normal cardiovascular exam Rhythm:Regular Rate:Normal     Neuro/Psych negative neurological ROS  negative psych ROS   GI/Hepatic Neg liver ROS,GERD  Medicated and Controlled,,  Endo/Other  BMI 31  Renal/GU negative Renal ROS  negative genitourinary   Musculoskeletal negative musculoskeletal ROS (+)    Abdominal  (+) + obese  Peds  Hematology negative hematology ROS (+) Hb 12.3, plt 245   Anesthesia Other Findings   Reproductive/Obstetrics Urine preg neg today                             Anesthesia Physical Anesthesia Plan  ASA: 2  Anesthesia Plan: General   Post-op Pain Management: Tylenol PO (pre-op)*, Toradol  IV (intra-op)*, Dilaudid IV and Ketamine IV*   Induction: Intravenous  PONV Risk Score and Plan: 3 and Ondansetron, Dexamethasone, Midazolam and Treatment may vary due to age or medical condition  Airway Management Planned: Oral ETT  Additional Equipment: None  Intra-op Plan:   Post-operative Plan: Extubation in OR  Informed Consent: I have reviewed the patients History and Physical, chart, labs and discussed the procedure including the risks, benefits and alternatives for the proposed anesthesia with the patient or authorized representative who has indicated his/her understanding and acceptance.     Dental advisory given  Plan Discussed with: CRNA  Anesthesia Plan Comments:        Anesthesia Quick  Evaluation

## 2023-06-22 NOTE — Discharge Instructions (Signed)

## 2023-06-22 NOTE — Anesthesia Postprocedure Evaluation (Signed)
 Anesthesia Post Note  Patient: Jennifer Nolan  Procedure(s) Performed: HYSTERECTOMY, TOTAL, LAPAROSCOPIC, ROBOT-ASSISTED WITH SALPINGECTOMY (Bilateral) CYSTOSCOPY     Patient location during evaluation: PACU Anesthesia Type: General Level of consciousness: awake and alert, oriented and patient cooperative Pain management: pain level controlled Vital Signs Assessment: post-procedure vital signs reviewed and stable Respiratory status: spontaneous breathing, nonlabored ventilation and respiratory function stable Cardiovascular status: blood pressure returned to baseline and stable Postop Assessment: no apparent nausea or vomiting Anesthetic complications: no   No notable events documented.  Last Vitals:  Vitals:   06/22/23 1220 06/22/23 1250  BP:    Pulse: 71 70  Resp: 17 16  Temp:    SpO2: 100% 100%    Last Pain:  Vitals:   06/22/23 1251  TempSrc:   PainSc: 4                  [name]

## 2023-06-22 NOTE — Interval H&P Note (Signed)
 Patient seen and examined. No changes to H&P Procedure reviewed and verified H&P signed All questions answered Blood pressure 133/87, pulse 78, temperature 97.9 F (36.6 C), temperature source Oral, resp. rate 15, height 5' 5.5" (1.664 m), weight 86.6 kg, SpO2 97%.  CBC    Component Value Date/Time   WBC 5.0 06/19/2023 1309   RBC 4.92 06/19/2023 1309   HGB 12.3 06/19/2023 1309   HGB 11.7 (L) 10/21/2015 1332   HCT 39.8 06/19/2023 1309   PLT 245 06/19/2023 1309   MCV 80.9 06/19/2023 1309   MCH 25.0 (L) 06/19/2023 1309   MCHC 30.9 06/19/2023 1309   RDW 14.7 06/19/2023 1309   Dr. Tia Flowers

## 2023-06-22 NOTE — Op Note (Signed)
 06/22/2023  [id number] Jennifer Nolan        OPERATIVE REPORT   Preop Diagnosis: menorrhagia, dysmenorrhea, anemia, enlarging fibroid uterus in perimenopause Procedure: robotic hysterectomy, bilateral salpingectomy, cystoscopy   Surgeon: Dr. Arvell Birchwood Wren Gallaga Assistant: Santana Cue, RN   Fluids: please see anesthesia report   Complications: None Anesthesia: General     Findings:  boggy contour 8cm uterus, normal ovaries and tubes Cystoscopy at the end of the case with normal bladder and patent ureters bilaterally.   Estimated blood loss: Minimal <15cc   Specimens: Uterus, cervix and bilateral tubes   Disposition of specimen: Pathology           Patient is taken to the operating room. She is placed in the supine position. She is a running IV in place. Informed consent was present on the chart. SCDs on her lower extremities and functioning properly. Patient was positioned while she was awake.  Her legs were placed in the low lithotomy position in Rhodes stirrups. Her arms were tucked by the side.  General endotracheal anesthesia was administered by the anesthesia staff without difficulty.       Clora prep was then used to prep the abdomen and Hibiclens was used to prep the inner thighs, perineum and vagina. Once 3 minutes had past the patient was draped in a normal standard fashion. A proper time out was performed and everyone agreed.  The legs were lifted to the high lithotomy position. A bivalve speculum was inserted into the vagina and the anterior lip of the cervix was grasped with single-tooth tenaculum.  The uterus sounded to 8 cm. Pratt dilators were used to dilate the cervix.  The RUMI uterine manipulator was obtained inserted into the endometrial cavity and the bulb of the disposable tip was inflated with 8 cc of normal saline. There was a good fit of the KOH ring around the cervix. The tenaculum and bivavle speculum was removed. There is also good manipulation  of the uterus.  A Foley catheter was placed to straight drain.  Clear urine was noted. Legs were lowered to the low lithotomy position and attention was turned the abdomen.   Superior to the umbilicus, marcaine 0.25% used to anesthetize the skin.  Using #11 blade, 8mm skin incision was made.  The 8mm robotic trocar and sleeve was inserted under direct visualization.  CO2 gas was  started and patient was placed in trendelenburg position.  Two additional 8mm ports were placed under direct visualization in the left and right lower quadrant.     Ureters were identifies.  Attention was turned to the left side. The left tube was elevated and the mesosalpinx was desiccated with the vessel sealer.  The left uterine ovarian pedicle was serially clamped cauterized and incised. Left round ligament was serially clamped cauterized and incised. The anterior and posterior peritoneum of the inferior leaf of the broad ligament were opened. The beginning of the bladder flap was created.  The bladder was taken down below the level of the KOH ring. The left uterine artery skeletonized and then just superior to the KOH ring this vessel was serially clamped, cauterized, and incised.   Attention was turned the right side.  The uterus was placed on stretch to the opposite side.    The mesosalpinx was incised freeing the tube. Then the right uterine ovarian pedicle was serially clamped cauterized and incised. Next the right round ligament was serially clamped cauterized and incised. The anterior posterior peritoneum of the inferiorly for  the broad ligament were opened. The anterior peritoneum was carried across to the dissection on the left side. The remainder of the bladder flap was created using sharp dissection. The bladder was well below the level of the KOH ring. The right uterine artery skeletonized. Then the right uterine artery, above the level of the KOH ring, was serially clamped cauterized and incised. The uterus was  devascularized at this point.   The colpotomy was performed.  This was carried around a circumferential fashion until the vaginal mucosa was completely incised in the specimen was freed.  The specimen was then delivered to the vagina intact.  A vaginal occlusive device was used to maintain the pneumoperitoneum   Instruments were changed with a needle driver and prograsp.  Using a 9 inch  zero V-lock suture, the cuff was closed by incorporating the anterior and posterior vaginal mucosa in each stitch. This was carried across all the way to the left corner and a running fashion. Two stitches were brought back towards the midline and the suture was cut flush with the vagina. The needle was brought out the pelvis. The pelvis was irrigated. All pedicles were inspected. No bleeding was noted.   Co2 pressures were lowered to 8mm Hg.  Again, no bleeding was noted.  Ureters were noted deep in the pelvis to be peristalsing.  At this point the procedure was completed.  The remaining instruments were removed.  The ports were removed under direct visualization of the laparoscope and the pneumoperitoneum was relieved.   The skin was then closed with subcuticular stitches of 3-0 Vicryl. The skin was cleansed Dermabond was applied. Attention was then turned the vagina and the cuff was inspected. No bleeding was noted.  The Foley catheter was removed.  Cystoscopy was performed.  No sutures or bladder injuries were noted.  Ureters were noted with normal urine jets from each one was seen.  Foley was left out after the cystoscopic fluid was drained and cystoscope removed.  Sponge, lap, needle, instrument counts were correct x2. Patient tolerated the procedure very well. She was awakened from anesthesia, extubated and taken to recovery in stable condition.      Dr. Tia Flowers

## 2023-06-22 NOTE — Transfer of Care (Signed)
 Immediate Anesthesia Transfer of Care Note  Patient: Jennifer Nolan  Procedure(s) Performed: HYSTERECTOMY, TOTAL, LAPAROSCOPIC, ROBOT-ASSISTED WITH SALPINGECTOMY (Bilateral) CYSTOSCOPY  Patient Location: PACU  Anesthesia Type:General  Level of Consciousness: drowsy  Airway & Oxygen Therapy: Patient Spontanous Breathing  Post-op Assessment: Report given to RN  Post vital signs: stable  Last Vitals:  Vitals Value Taken Time  BP 128/80 06/22/23 1150  Temp    Pulse 66 06/22/23 1152  Resp 18 06/22/23 1152  SpO2 96 % 06/22/23 1152  Vitals shown include unfiled device data.  Last Pain:  Vitals:   06/22/23 0750  TempSrc: Oral  PainSc: 0-No pain      Patients Stated Pain Goal: 5 (06/22/23 0750)  Complications: No notable events documented.

## 2023-06-23 ENCOUNTER — Encounter (HOSPITAL_COMMUNITY): Payer: Self-pay | Admitting: Obstetrics and Gynecology

## 2023-06-25 LAB — SURGICAL PATHOLOGY

## 2023-06-28 ENCOUNTER — Other Ambulatory Visit: Payer: Self-pay | Admitting: Obstetrics and Gynecology

## 2023-06-28 NOTE — Telephone Encounter (Signed)
 Med refill request: Estrace   Last AEX: 04/26/23 last OV 05/30/23 Next AEX: next OV 07/11/23 Last MMG (if hormonal med) 09/07/22 birads cat 1 neg  Refill authorized: last rx 05/30/23 #42.5g with 0 refills. Please approve or deny

## 2023-07-11 ENCOUNTER — Encounter: Payer: Self-pay | Admitting: Obstetrics and Gynecology

## 2023-07-11 ENCOUNTER — Ambulatory Visit (INDEPENDENT_AMBULATORY_CARE_PROVIDER_SITE_OTHER): Admitting: Obstetrics and Gynecology

## 2023-07-11 VITALS — BP 114/80 | HR 83 | Temp 98.0°F

## 2023-07-11 DIAGNOSIS — R309 Painful micturition, unspecified: Secondary | ICD-10-CM

## 2023-07-11 DIAGNOSIS — Z09 Encounter for follow-up examination after completed treatment for conditions other than malignant neoplasm: Secondary | ICD-10-CM

## 2023-07-11 LAB — URINALYSIS, COMPLETE W/RFL CULTURE
Glucose, UA: NEGATIVE
Ketones, ur: NEGATIVE
Nitrites, Initial: NEGATIVE
Protein, ur: NEGATIVE

## 2023-07-11 MED ORDER — CEFTRIAXONE SODIUM 1 G IJ SOLR
1.0000 g | Freq: Once | INTRAMUSCULAR | Status: AC
  Administered 2023-07-11: 1 g via INTRAMUSCULAR

## 2023-07-11 MED ORDER — FLUCONAZOLE 150 MG PO TABS: 150.0000 mg | ORAL_TABLET | Freq: Once | ORAL | 0 refills | Status: DC

## 2023-07-11 MED ORDER — INTRAROSA 6.5 MG VA INST: 1.0000 | VAGINAL_INSERT | Freq: Every evening | VAGINAL | 12 refills | Status: DC | PRN

## 2023-07-11 MED ORDER — FLUCONAZOLE 150 MG PO TABS: 150.0000 mg | ORAL_TABLET | Freq: Once | ORAL | 0 refills | Status: AC

## 2023-07-11 NOTE — Progress Notes (Signed)
   Acute Office Visit  Subjective:    Patient ID: Thurlow LOISE Haddock, female    DOB: [birth year], 53 y.o.   MRN: [id number]   HPI 53 y.o. presents today for Post-op Follow-up (Post op//jj/06-22-23 robotic hysterectomy, bilateral salpingectomy, cystoscopy/Pt c/o razor pain with urination, & frequency, a little better today) . Patient with h/o dyspareunia. To begin intrarosa at 8 wks post continue with vaginal estrogen cream daily now until then No LMP recorded (lmp unknown). Patient has had a hysterectomy.    Review of Systems     Objective:    OBGyn Exam  BP 114/80   Pulse 83   Temp 98 F (36.7 C) (Oral)   LMP  (LMP Unknown)   SpO2 99%  Wt Readings from Last 3 Encounters:  06/22/23 191 lb (86.6 kg)  05/30/23 191 lb (86.6 kg)  04/26/23 194 lb (88 kg)        Patient informed chaperone available to be present for breast and/or pelvic exam. Patient has requested no chaperone to be present. Patient has been advised what will be completed during breast and pelvic exam.   Assessment & Plan:  UTI Rocephin IM given today. Diflucan sent to pharmacy to prevent a yeast infection  Almarie MARLA Carpen

## 2023-07-11 NOTE — Addendum Note (Signed)
 Addended by: VICCI ARVIN PARAS on: 07/11/2023 04:39 PM   Modules accepted: Orders

## 2023-07-12 ENCOUNTER — Ambulatory Visit: Payer: Self-pay | Admitting: Obstetrics and Gynecology

## 2023-07-13 LAB — URINALYSIS, COMPLETE W/RFL CULTURE
Bilirubin Urine: NEGATIVE
Hyaline Cast: NONE SEEN /LPF
Specific Gravity, Urine: 1.025 (ref 1.001–1.035)
pH: 5.5 (ref 5.0–8.0)

## 2023-07-13 LAB — URINE CULTURE
MICRO NUMBER:: 16623659
Result:: NO GROWTH
SPECIMEN QUALITY:: ADEQUATE

## 2023-07-13 LAB — CULTURE INDICATED

## 2023-08-04 ENCOUNTER — Other Ambulatory Visit: Payer: Self-pay | Admitting: Nurse Practitioner

## 2023-08-04 DIAGNOSIS — N921 Excessive and frequent menstruation with irregular cycle: Secondary | ICD-10-CM

## 2023-08-04 DIAGNOSIS — N946 Dysmenorrhea, unspecified: Secondary | ICD-10-CM

## 2023-08-10 ENCOUNTER — Other Ambulatory Visit: Payer: Self-pay | Admitting: Nurse Practitioner

## 2023-08-10 DIAGNOSIS — B009 Herpesviral infection, unspecified: Secondary | ICD-10-CM

## 2023-08-10 NOTE — Telephone Encounter (Signed)
 Medication refill request: valtrex  500mg  Last AEX:  04-26-23 with TW Next AEX: not scheduled Last MMG (if hormonal medication request): n/a Refill authorized: please approve 90 day if appropriate

## 2023-08-28 ENCOUNTER — Other Ambulatory Visit: Payer: Self-pay | Admitting: Obstetrics and Gynecology

## 2023-08-28 ENCOUNTER — Encounter: Payer: Self-pay | Admitting: Obstetrics and Gynecology

## 2023-08-28 ENCOUNTER — Ambulatory Visit (INDEPENDENT_AMBULATORY_CARE_PROVIDER_SITE_OTHER): Admitting: Obstetrics and Gynecology

## 2023-08-28 VITALS — BP 104/76 | HR 80 | Ht 65.0 in | Wt 193.0 lb

## 2023-08-28 DIAGNOSIS — Z09 Encounter for follow-up examination after completed treatment for conditions other than malignant neoplasm: Secondary | ICD-10-CM

## 2023-08-29 NOTE — Telephone Encounter (Signed)
 Med refill request: estradiol  (ESTRACE ) 0.1 MG/GM vaginal cream  Last AEX: 04/26/23 Next AEX: not yet scheduled Last MMG (if hormonal med): 08/18/22 Pt scheduled for an office visit on 09/10/23 Last Rx sent #42.5 g with 0 refills. Please approve or deny.  Refill authorized?

## 2023-08-29 NOTE — Progress Notes (Signed)
   Acute Office Visit  Subjective:    Patient ID: Jennifer Nolan, female    DOB: 1970/09/19, 53 y.o.   MRN: [id number]   HPI 53 y.o. presents today for Post-op Follow-up (10 week follow up - Sx done 06-22-23 robotic hysterectomy, bilateral salpingectomy, cystoscopy/Reports off and on nausea symptom. Denies emesis. Concerns with starting HRT for menopause symptoms - brain fog, dry skin, hair loss) .  No LMP recorded (lmp unknown). Patient has had a hysterectomy.    Review of Systems     Objective:    OBGyn Exam  BP 104/76 (BP Location: Right Arm, Patient Position: Sitting, Cuff Size: Normal)   Pulse 80   Ht 5' 5 (1.651 m)   Wt 193 lb (87.5 kg)   LMP  (LMP Unknown)   SpO2 99%   BMI 32.12 kg/m  Wt Readings from Last 3 Encounters:  08/28/23 193 lb (87.5 kg)  06/22/23 191 lb (86.6 kg)  05/30/23 191 lb (86.6 kg)        SVE: cuff intact. Normal discharge. Slight tenderness to q tip palpation  Assessment & Plan:  10 wk PO RLH 2 more weeks of pelvic rest and continue vaginal estrogen or intrarosa . To see [name] for HRT. In 1-2 weeks. 2. RTC with any concerns and for annual exam with Jennifer Nolan.  Dr. Glennon Norris MARLA Glennon

## 2023-09-10 ENCOUNTER — Ambulatory Visit: Admitting: Nurse Practitioner

## 2023-09-12 ENCOUNTER — Ambulatory Visit (INDEPENDENT_AMBULATORY_CARE_PROVIDER_SITE_OTHER): Admitting: Nurse Practitioner

## 2023-09-12 ENCOUNTER — Encounter: Payer: Self-pay | Admitting: Nurse Practitioner

## 2023-09-12 VITALS — BP 110/50 | HR 60 | Wt 196.0 lb

## 2023-09-12 DIAGNOSIS — Z79890 Hormone replacement therapy (postmenopausal): Secondary | ICD-10-CM

## 2023-09-12 MED ORDER — ESTRADIOL 0.025 MG/24HR TD PTTW: 1.0000 | MEDICATED_PATCH | TRANSDERMAL | 1 refills | Status: DC

## 2023-09-12 NOTE — Progress Notes (Signed)
   Acute Office Visit  Subjective:    Patient ID: Jennifer Nolan, female    DOB: 1970/02/18, 53 y.o.   MRN: [id number]   HPI 53 y.o. presents today to discuss HRT. 06/22/23 hysterectomy. Complains of brain fog, dry skin, irritability. Using vaginal estrogen and Intrarosa  post surgery prescribed by Dr. Glennon. Was having irregular periods prior to hysterectomy.   No LMP recorded (lmp unknown). Patient has had a hysterectomy.    Review of Systems  Constitutional: Negative.   Skin:        Dry skin, change in hair texture  Psychiatric/Behavioral:  Positive for agitation and decreased concentration.        Objective:    Physical Exam Constitutional:      Appearance: Normal appearance.     BP (!) 110/50   Pulse 60   Wt 196 lb (88.9 kg)   LMP  (LMP Unknown)   SpO2 99%   BMI 32.62 kg/m  Wt Readings from Last 3 Encounters:  09/12/23 196 lb (88.9 kg)  08/28/23 193 lb (87.5 kg)  06/22/23 191 lb (86.6 kg)        Assessment & Plan:   Problem List Items Addressed This Visit   None Visit Diagnoses       Hormone replacement therapy    -  Primary      Plan:  Discussed risks and benefits of HRT. Will start Estradiol  patch 0.025 mg twice weekly. Mag L-threonate. Continue vaginal estrogen twice weekly.   Return in about 4 weeks (around 10/10/2023) for Med follow up.    Jennifer DELENA Shutter DNP, 1:57 PM 09/12/2023

## 2023-10-01 ENCOUNTER — Other Ambulatory Visit: Payer: Self-pay | Admitting: Nurse Practitioner

## 2023-10-01 NOTE — Telephone Encounter (Signed)
.  Med refill request: Estrace   Last AEX: 04/26/23 Next OV: 10/11/23 Last MMG (if hormonal med) 09/07/22- BI-RADS CATEGORY 1: Negative.  Refill authorized: Please Advise?

## 2023-10-11 ENCOUNTER — Encounter: Payer: Self-pay | Admitting: Nurse Practitioner

## 2023-10-11 ENCOUNTER — Ambulatory Visit (INDEPENDENT_AMBULATORY_CARE_PROVIDER_SITE_OTHER): Admitting: Nurse Practitioner

## 2023-10-11 ENCOUNTER — Other Ambulatory Visit: Payer: Self-pay | Admitting: Nurse Practitioner

## 2023-10-11 VITALS — BP 108/60 | HR 74 | Resp 16 | Wt 199.0 lb

## 2023-10-11 DIAGNOSIS — Z79890 Hormone replacement therapy (postmenopausal): Secondary | ICD-10-CM

## 2023-10-11 DIAGNOSIS — Z1231 Encounter for screening mammogram for malignant neoplasm of breast: Secondary | ICD-10-CM

## 2023-10-11 MED ORDER — ESTRADIOL 0.05 MG/24HR TD PTTW: 1.0000 | MEDICATED_PATCH | TRANSDERMAL | 2 refills | Status: DC

## 2023-10-11 MED ORDER — PROGESTERONE MICRONIZED 100 MG PO CAPS: 100.0000 mg | ORAL_CAPSULE | Freq: Every evening | ORAL | 2 refills | Status: DC

## 2023-10-11 NOTE — Progress Notes (Signed)
   Acute Office Visit  Subjective:    Patient ID: Jennifer Nolan, female    DOB: [birth year], 53 y.o.   MRN: [id number]   HPI 53 y.o. presents today for 4-week follow up. Started estradiol  0.025 mg patch for brain fog, dry skin, irritability, hot flashes. Felt much improvement in symptoms first 1-2 weeks but then this declined. Started Mag L-threonate a week ago. Using vaginal estrogen for cuff healing and dryness. S/P 06/22/23 hysterectomy. She is also experiencing poor sleep.   No LMP recorded (lmp unknown). Patient has had a hysterectomy.    Review of Systems  Constitutional: Negative.   Endocrine: Positive for heat intolerance.  Psychiatric/Behavioral:  Positive for agitation, decreased concentration and sleep disturbance.        Objective:    Physical Exam Constitutional:      Appearance: Normal appearance.     BP 108/60   Pulse 74   Resp 16   Wt 199 lb (90.3 kg)   LMP  (LMP Unknown)   BMI 33.12 kg/m  Wt Readings from Last 3 Encounters:  10/11/23 199 lb (90.3 kg)  09/12/23 196 lb (88.9 kg)  08/28/23 193 lb (87.5 kg)        Assessment & Plan:   Problem List Items Addressed This Visit   None Visit Diagnoses       Hormone replacement therapy    -  Primary   Relevant Medications   progesterone  (PROMETRIUM ) 100 MG capsule   estradiol  (VIVELLE -DOT) 0.05 MG/24HR patch      Plan: Increase estradiol  patch, add Prometrium  nightly. Continue mag L-threonate.   Return if symptoms worsen or fail to improve.    [name] DNP, 2:26 PM 10/11/2023

## 2023-10-23 ENCOUNTER — Ambulatory Visit
Admission: RE | Admit: 2023-10-23 | Discharge: 2023-10-23 | Disposition: A | Discharge status: Home / Self Care | Source: Ambulatory Visit | Attending: Nurse Practitioner | Admitting: Nurse Practitioner

## 2023-10-23 DIAGNOSIS — Z1231 Encounter for screening mammogram for malignant neoplasm of breast: Secondary | ICD-10-CM

## 2023-11-08 ENCOUNTER — Other Ambulatory Visit (HOSPITAL_BASED_OUTPATIENT_CLINIC_OR_DEPARTMENT_OTHER): Payer: Self-pay

## 2023-11-16 ENCOUNTER — Other Ambulatory Visit (HOSPITAL_BASED_OUTPATIENT_CLINIC_OR_DEPARTMENT_OTHER): Payer: Self-pay

## 2023-11-16 MED ORDER — BOOSTRIX 5-2.5-18.5 LF-MCG/0.5 IM SUSY
0.5000 mL | PREFILLED_SYRINGE | Freq: Once | INTRAMUSCULAR | 0 refills | Status: AC
  Filled 2023-11-16: qty 0.5, 1d supply, fill #0

## 2023-11-17 ENCOUNTER — Encounter (HOSPITAL_COMMUNITY): Payer: Self-pay

## 2023-11-17 ENCOUNTER — Ambulatory Visit (HOSPITAL_COMMUNITY)
Admission: EM | Admit: 2023-11-17 | Discharge: 2023-11-17 | Disposition: A | Discharge status: Home / Self Care | Attending: Family Medicine | Admitting: Family Medicine

## 2023-11-17 DIAGNOSIS — S90852A Superficial foreign body, left foot, initial encounter: Secondary | ICD-10-CM | POA: Diagnosis not present

## 2023-11-17 MED ORDER — LIDOCAINE-EPINEPHRINE 1 %-1:100000 IJ SOLN
INTRAMUSCULAR | Status: AC
  Filled 2023-11-17: qty 1

## 2023-11-17 MED ORDER — MUPIROCIN 2 % EX OINT: 1.0000 | TOPICAL_OINTMENT | Freq: Two times a day (BID) | CUTANEOUS | 0 refills | Status: AC

## 2023-11-17 NOTE — ED Triage Notes (Signed)
 Pt states splinter to left foot for the past 2 weeks.  States she has been trying to remove it herself but was unable to.

## 2023-11-21 NOTE — ED Provider Notes (Signed)
 [facility]   [id number] 11/17/23 Arrival Time: 1516  ASSESSMENT & PLAN:  1. Foreign body in left foot, initial encounter     Incision and Drainage Procedure Note  Anesthesia: 1% lidocaine  with epinephrine  Procedure Details  The procedure, risks and complications have been discussed in detail (including, but not limited to pain and bleeding) with the patient.  The skin induration was prepped and draped in the usual fashion. After adequate local anesthesia, small few mm incision made just to the side of FB entrance with a #11 blade on the left lateral mid-plantar foot. With careful exploration an approx 3-31mm sliver of glass removed with hemostats. I did notice just a drop of pus when skin opened but did not see any other signs of infection.  Condition: Tolerated procedure well Complications: none. Minimal bleeding.  Meds ordered this encounter  Medications   mupirocin ointment (BACTROBAN) 2 %    Sig: Apply 1 Application topically 2 (two) times daily.    Dispense:  22 g    Refill:  0     Wound care instructions discussed and given in written format. To return in 48 hours for wound check.  Finish all antibiotics. OTC analgesics as needed.  Reviewed expectations re: course of current medical issues. Questions answered. Outlined signs and symptoms indicating need for more acute intervention. Patient verbalized understanding. After Visit Summary given.   SUBJECTIVE:  Jennifer Nolan is a 53 y.o. female who presents with a possible FB in L plantar foot. Pt states splinter to left foot for the past 2 weeks.  States she has been trying to remove it herself but was unable to. Denies erythema. Area is painful.   OBJECTIVE:  Vitals:   11/17/23 1633  BP: 137/88  Pulse: 83  Resp: 16  Temp: 98.4 F (36.9 C)  TempSrc: Oral  SpO2: 99%     General appearance: alert; no distress LLE: left lateral plantar foot with very small induration at place where she  points to; no drainage/bleedin Psychological: alert and cooperative; normal mood and affect  No Known Allergies  Past Medical History:  Diagnosis Date   Anemia    DDD (degenerative disc disease), lumbar    Dysmenorrhea    GERD (gastroesophageal reflux disease)    History of abnormal cervical Pap smear    History of herpes genitalis    HSV2   Hypertension    Leiomyoma of uterus    Menorrhagia    Multinodular thyroid  2008   bilateral cystic nodules , biospy 01-16-2007 (results in epic)  hyperplastic nodule/ nonneoplastic goiter (nontoxic)   Wears contact lenses    Social History   Socioeconomic History   Marital status: Married    Spouse name: Not on file   Number of children: Not on file   Years of education: Not on file   Highest education level: Not on file  Occupational History   Not on file  Tobacco Use   Smoking status: Never    Passive exposure: Never   Smokeless tobacco: Never  Vaping Use   Vaping status: Never Used  Substance and Sexual Activity   Alcohol use: Yes    Comment: Occassional   Drug use: Never   Sexual activity: Not Currently    Partners: Male    Birth control/protection: Abstinence, Surgical    Comment: menarche 53yo, sexual  debut 53yo, hysterectomy  Other Topics Concern   Not on file  Social History Narrative   Not on file  Social Drivers of Corporate Investment Banker Strain: Not on file  Food Insecurity: Not on file  Transportation Needs: Not on file  Physical Activity: Not on file  Stress: Not on file  Social Connections: Not on file   Family History  Problem Relation Age of Onset   Hypertension Mother    Diabetes Mother    Hypertension Maternal Grandmother    Breast cancer Neg Hx    Colon polyps Neg Hx    Colon cancer Neg Hx    Esophageal cancer Neg Hx    Liver cancer Neg Hx    Pancreatic cancer Neg Hx    Stomach cancer Neg Hx    Past Surgical History:  Procedure Laterality Date   COLONOSCOPY WITH PROPOFOL    01/21/2021   dr s. stacia   CYSTOSCOPY N/A 06/22/2023   Procedure: CYSTOSCOPY;  Surgeon: Glennon Almarie POUR, MD;  Location: [facility] OR;  Service: Gynecology;  Laterality: N/A;   ESOPHAGOGASTRODUODENOSCOPY (EGD) WITH PROPOFOL   03/25/2021   dr s. stacia   HYSTERECTOMY, TOTAL, LAPAROSCOPIC, ROBOT-ASSISTED WITH SALPINGECTOMY Bilateral 06/22/2023   Procedure: HYSTERECTOMY, TOTAL, LAPAROSCOPIC, ROBOT-ASSISTED WITH SALPINGECTOMY;  Surgeon: Glennon Almarie POUR, MD;  Location: [facility] OR;  Service: Gynecology;  Laterality: Bilateral;   WISDOM TOOTH EXTRACTION  02/2023            Rolinda Rogue, MD 11/21/23 [phone]

## 2023-12-14 ENCOUNTER — Encounter: Payer: Self-pay | Admitting: Nurse Practitioner

## 2023-12-17 ENCOUNTER — Other Ambulatory Visit: Payer: Self-pay

## 2023-12-17 ENCOUNTER — Other Ambulatory Visit: Payer: Self-pay | Admitting: Nurse Practitioner

## 2023-12-17 DIAGNOSIS — N941 Unspecified dyspareunia: Secondary | ICD-10-CM

## 2023-12-17 DIAGNOSIS — N951 Menopausal and female climacteric states: Secondary | ICD-10-CM

## 2023-12-17 MED ORDER — ESTRADIOL 0.01 % VA CREA: 1.0000 g | TOPICAL_CREAM | VAGINAL | 0 refills | Status: AC

## 2023-12-17 NOTE — Telephone Encounter (Signed)
 Med refill request: estrace   Last AEX: last OV 10/11/23 Next AEX: n/a message sent to fd  Last MMG (if hormonal med) Refill authorized: last rx 10/01/23 42.5 g wit 2 refills. Patint requesting medication be sent through [facility] now instead of [facility]. Please Advise?

## 2023-12-19 ENCOUNTER — Other Ambulatory Visit: Payer: Self-pay

## 2023-12-19 DIAGNOSIS — Z79890 Hormone replacement therapy (postmenopausal): Secondary | ICD-10-CM

## 2023-12-19 MED ORDER — PROGESTERONE MICRONIZED 100 MG PO CAPS: 100.0000 mg | ORAL_CAPSULE | Freq: Every evening | ORAL | 1 refills | Status: DC

## 2023-12-19 MED ORDER — ESTRADIOL 0.05 MG/24HR TD PTTW: 1.0000 | MEDICATED_PATCH | TRANSDERMAL | 1 refills | Status: DC

## 2023-12-19 NOTE — Telephone Encounter (Signed)
 Med refill request:  progesterone  (PROMETRIUM ) 100 MG capsule  Start:  10/11/23 Disp:  30 capsules Refills:  2  estradiol  (VIVELLE -DOT) 0.05 MG/24HR patch  Start:  10/11/23 Disp:  8 patches Refills:  2  *Pt would like to use [facility], but was told she needed Provider Approval? I just changed her Pharmacy to [facility]. Is that alright? Please advise.  Last OV: 10/11/23 Last AEX:  04/26/23 Next AEX:  Not yet scheduled Last MMG (if hormonal med):  10/23/23 Refill authorized? Please Advise.

## 2023-12-28 ENCOUNTER — Other Ambulatory Visit: Payer: Self-pay

## 2023-12-28 DIAGNOSIS — Z79890 Hormone replacement therapy (postmenopausal): Secondary | ICD-10-CM

## 2023-12-28 NOTE — Telephone Encounter (Signed)
 Pt called to say she contacted [facility] to find out how much longer her wait was and was told these medications are currently unavailable and they do not know when they will come back in stock.  Pt is asking if NP could please cancel [facility] order and resend these 2 prescription refill requests back to: [id number] - [place], [place] - [address] AT [facility] [address] [facility] [address] [phone]   Estradiol  (Vivelle -Dot) 0.05 mg/24 hr patch Progesterone  (Prometrium )

## 2023-12-30 MED ORDER — ESTRADIOL 0.05 MG/24HR TD PTTW: 1.0000 | MEDICATED_PATCH | TRANSDERMAL | 1 refills | Status: AC

## 2023-12-30 MED ORDER — PROGESTERONE MICRONIZED 100 MG PO CAPS: 100.0000 mg | ORAL_CAPSULE | Freq: Every evening | ORAL | 1 refills | Status: AC

## 2024-01-08 NOTE — Telephone Encounter (Signed)
 Please be informed,  Pt called and left a message stating she only wants to use [facility] for her long term prescriptions.

## 2024-02-04 NOTE — Addendum Note (Signed)
 Addended by: BRUTUS KATE SAILOR on: 02/04/2024 12:37 PM   Modules accepted: Orders
